# Patient Record
Sex: Male | Born: 1955 | Race: Black or African American | Hispanic: No | Marital: Single | Smoking: Current every day smoker
Health system: Southern US, Community
[De-identification: ages and names within clinical notes are randomized; demographics above are authoritative.]

## PROBLEM LIST (undated history)

## (undated) DIAGNOSIS — F10239 Alcohol dependence with withdrawal, unspecified: Secondary | ICD-10-CM

## (undated) DIAGNOSIS — Z01810 Encounter for preprocedural cardiovascular examination: Secondary | ICD-10-CM

## (undated) DIAGNOSIS — J9600 Acute respiratory failure, unspecified whether with hypoxia or hypercapnia: Secondary | ICD-10-CM

## (undated) DIAGNOSIS — E119 Type 2 diabetes mellitus without complications: Secondary | ICD-10-CM

## (undated) DIAGNOSIS — H5440 Blindness, one eye, unspecified eye: Secondary | ICD-10-CM

## (undated) DIAGNOSIS — H5410 Blindness, one eye, low vision other eye, unspecified eyes: Secondary | ICD-10-CM

## (undated) DIAGNOSIS — I739 Peripheral vascular disease, unspecified: Secondary | ICD-10-CM

## (undated) DIAGNOSIS — K7469 Other cirrhosis of liver: Secondary | ICD-10-CM

## (undated) DIAGNOSIS — D649 Anemia, unspecified: Secondary | ICD-10-CM

## (undated) DIAGNOSIS — H5450 Low vision, one eye, unspecified eye: Secondary | ICD-10-CM

## (undated) HISTORY — PX: KNEE SURGERY: SHX244

## (undated) HISTORY — DX: Acute respiratory failure, unspecified whether with hypoxia or hypercapnia: J96.00

## (undated) HISTORY — DX: Blindness, one eye, low vision other eye, unspecified eyes: H54.10

## (undated) HISTORY — PX: EYE SURGERY: SHX253

## (undated) HISTORY — DX: Type 2 diabetes mellitus without complications: E11.9

## (undated) HISTORY — DX: Peripheral vascular disease, unspecified: I73.9

## (undated) HISTORY — DX: Low vision, one eye, unspecified eye: H54.50

## (undated) HISTORY — DX: Encounter for preprocedural cardiovascular examination: Z01.810

## (undated) HISTORY — DX: Blindness, one eye, unspecified eye: H54.40

## (undated) HISTORY — DX: Other cirrhosis of liver: K74.69

## (undated) HISTORY — DX: Alcohol dependence with withdrawal, unspecified: F10.239

## (undated) HISTORY — DX: Hypomagnesemia: E83.42

## (undated) HISTORY — DX: Anemia, unspecified: D64.9

---

## 2014-09-13 ENCOUNTER — Encounter: Payer: Self-pay | Admitting: Vascular Surgery

## 2014-09-14 ENCOUNTER — Encounter: Payer: Self-pay | Admitting: Vascular Surgery

## 2014-09-14 ENCOUNTER — Other Ambulatory Visit: Payer: Self-pay

## 2014-09-14 ENCOUNTER — Ambulatory Visit (INDEPENDENT_AMBULATORY_CARE_PROVIDER_SITE_OTHER): Payer: Medicaid Other | Admitting: Vascular Surgery

## 2014-09-14 VITALS — BP 130/83 | HR 85 | Resp 18 | Ht 69.5 in | Wt 152.0 lb

## 2014-09-14 DIAGNOSIS — I70269 Atherosclerosis of native arteries of extremities with gangrene, unspecified extremity: Secondary | ICD-10-CM

## 2014-09-14 HISTORY — DX: Atherosclerosis of native arteries of extremities with gangrene, unspecified extremity: I70.269

## 2014-09-14 NOTE — Progress Notes (Signed)
Referred by:  Joaquin Music, NP [address] [place], [place] [address]  Reason for referral: left foot gangrene  History of Present Illness  Greg Davis is a 59 y.o. ([birth year]) male who presents with chief complaint: pain in left foot.  Onset of symptom occurred 3 months ago.  Pain is described as sharp, severity 1-10/10, and associated with movement and rest.  Patient has attempted to treat this pain with pain medications.  The patient has rest pain symptoms also and left ischemic toes.  The patient denies any fever or chills.  He does note some drainage from the left toes.  Atherosclerotic risks include: DM, smoking   Past Medical History  Diagnosis Date  . Diabetes mellitus without complication   . Anemia   . Other cirrhosis of liver   . Acute respiratory failure, unspecified whether with hypoxia or hypercapnia   . Alcohol dependence with withdrawal   . Hypomagnesemia   . 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 Knee surgery    . Eye surgery      History   Social History  . Marital Status: Single    Spouse Name: N/A  . Number of Children: N/A  . Years of Education: N/A   Occupational History  . Not on file.   Social History Main Topics  . Smoking status: Current Every Day Smoker -- 15 years    Types: Cigarettes  . Smokeless tobacco: Never Used  . Alcohol Use: 0.0 oz/week    0 Standard drinks or equivalent per week  . Drug Use: No  . Sexual Activity: Not on file   Other Topics Concern  . Not on file   Social History Narrative  . No narrative on file    Family History: patient is unable to recall his family's medical history  Current Outpatient Prescriptions  Medication Sig Dispense Refill  . ARTIFICIAL TEAR OINTMENT OP Apply to eye 3 (three) times daily.    Marland Kitchen aspirin 81 MG tablet Take 81 mg by mouth daily.    Marland Kitchen diltiazem (TIAZAC) 240 MG 24 hr capsule Take 240 mg by  mouth daily.    . famotidine (PEPCID) 20 MG tablet Take 20 mg by mouth 2 (two) times daily.    . folic acid (FOLVITE) 1 MG tablet Take 1 mg by mouth daily.    . metoprolol succinate (TOPROL-XL) 25 MG 24 hr tablet Take 25 mg by mouth daily. Take 1/2 tablet bid    . Oxycodone HCl 10 MG TABS Take 10 mg by mouth daily. 10 mg daily prior to wound care  Per medication record from nursing home    . polyethylene glycol (MIRALAX / GLYCOLAX) packet Take 17 g by mouth daily.    . pregabalin (LYRICA) 50 MG capsule Take 50 mg by mouth 2 (two) times daily.    Marland Kitchen senna (SENOKOT) 8.6 MG TABS tablet Take 2 tablets by mouth at bedtime.    . thiamine (VITAMIN B-1) 100 MG tablet Take 100 mg by mouth daily.    Marland Kitchen HYDROcodone-acetaminophen (NORCO/VICODIN) 5-325 MG per tablet Take 1 tablet by mouth every 6 (six) hours as needed for moderate pain.    . traMADol (ULTRAM) 50 MG tablet Take 50 mg by mouth every 6 (six) hours as needed.     No current facility-administered medications for this visit.     No Known Allergies   REVIEW  OF SYSTEMS:  (Positives checked otherwise negative)  CARDIOVASCULAR:  []  chest pain, []  chest pressure, []  palpitations, []  shortness of breath when laying flat, []  shortness of breath with exertion,  [x]  pain in feet when walking, [x]  pain in feet when laying flat, []  history of blood clot in veins (DVT), []  history of phlebitis, []  swelling in legs, []  varicose veins  PULMONARY:  []  productive cough, []  asthma, []  wheezing  NEUROLOGIC:  []  weakness in arms or legs, []  numbness in arms or legs, []  difficulty speaking or slurred speech, []  temporary loss of vision in one eye, []  dizziness  HEMATOLOGIC:  []  bleeding problems, []  problems with blood clotting too easily  MUSCULOSKEL:  []  joint pain, []  joint swelling  GASTROINTEST:  []  vomiting blood, []  blood in stool     GENITOURINARY:  []  burning with urination, []  blood in urine  PSYCHIATRIC:  []  history of major  depression  INTEGUMENTARY:  []  rashes, [x]  ulcers  CONSTITUTIONAL:  []  fever, []  chills   For VQI Use Only  PRE-ADM LIVING: Nursing home  AMB STATUS: Wheelchair  CAD Sx: None  PRIOR CHF: None  STRESS TEST: [x]  No, [ ]  Normal, [ ]  + ischemia, [ ]  + MI, [ ]  Both   Physical Examination  Filed Vitals:   09/14/14 1441  BP: 130/83  Pulse: 85  Resp: 18  Height: 5' 9.5" (1.765 m)  Weight: 152 lb (68.947 kg)   Body mass index is 22.13 kg/(m^2).  General: A&O x 3, WD, somewhat cachectic  Head: Becker/AT  Ear/Nose/Throat: Hearing grossly intact, nares w/o erythema or drainage, oropharynx w/o Erythema/Exudate, Mallampati score: 3  Eyes: PERRLA, EOMI  Neck: Supple, no nuchal rigidity, no palpable LAD  Pulmonary: Sym exp, good air movt, CTAB, no rales, rhonchi, & wheezing  Cardiac: RRR, Nl S1, S2, no Murmurs, rubs or gallops  Vascular: Vessel Right Left  Radial Palpable Palpable  Brachial Palpable Palpable  Carotid Palpable, without bruit Palpable, without bruit  Aorta Not palpable N/A  Femoral Faintly Palpable Not Palpable  Popliteal Not palpable Not palpable  PT Not Palpable Not Palpable  DP Not Palpable Not Palpable   Gastrointestinal: soft, NTND, -G/R, - HSM, - masses, - CVAT B  Musculoskeletal: M/S 5/5 in BUE, BLE test limited due to pain in L foot, Extremities without ischemic changes , L 3rd and 4th toe gangrene with some smell without frank purulent drainage, pain in L foot with manipulation  Neurologic: CN 2-12 intact , Pain and light touch intact in extremities except decreased in feet , Motor exam as listed above  Psychiatric: psychomotor delay, judgment appears to be intact, somewhat flat affect  Dermatologic: See M/S exam for extremity exam, no rashes otherwise noted  Lymph : No Cervical, Axillary, or Inguinal lymphadenopathy    Outside Studies/Documentation 1 pages of outside documents were reviewed including: outside arterial study.  Medical  Decision Making  Karma Ganjalbert Jager is a 59 y.o. 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  His co-morbidities, however, may limit his options, especially if his liver's synthetic function is limited.  Will probably need to admit the patient tomorrow to Hospitalist service for medical optimization given known history of cirrhosis and physical exam appearance consistent with protein malnutrition along with diabetes  I discussed with the patient the natural  history of critical limb ischemia: 25% require amputation in one year, 50% are able to maintain their limbs in one year, and 25-30% die in one year due to comorbidities.  Given the limb threatening status of this patient, I recommend an aggressive work up including proceeding with an: Aortogram, Bilateral runoff and ? LLE intervention. I discussed with the patient the nature of angiographic procedures, especially the limited patencies of any endovascular intervention. The patient is aware of that the risks of an angiographic procedure include but are not limited to: bleeding, infection, access site complications, embolization, rupture of treated vessel, dissection, possible need for emergent surgical intervention, and possible need for surgical procedures to treat the patient's pathology. The patient is aware of the risks and agrees to proceed.  The procedure is scheduled for: tomorrow 09/15/14.  I discussed in depth with the patient the nature of atherosclerosis, and emphasized the importance of maximal medical management including strict control of blood pressure, blood glucose, and lipid levels, antiplatelet agents, obtaining regular exercise, and cessation of smoking.  The patient is aware that without maximal medical management the underlying atherosclerotic disease process will  progress, limiting the benefit of any interventions. The patient is currently not on a statin.  Lipid panel along with LFT and nutrition labs will be drawn tomorrow. The patient is currently on an anti-platelet: ASA. Pt will also need baseline BLE ABI at some point.  Thank you for allowing [place] to participate in this patient's care.  Leonides Sake, MD [facility] Office: [phone] Pager: [phone]  09/14/2014, 3:15 PM

## 2014-09-15 ENCOUNTER — Ambulatory Visit (HOSPITAL_COMMUNITY)
Admission: RE | Admit: 2014-09-15 | Discharge: 2014-09-15 | Disposition: A | Discharge status: Still In Hospital | Payer: Medicaid Other | Source: Ambulatory Visit | Attending: Vascular Surgery | Admitting: Vascular Surgery

## 2014-09-15 ENCOUNTER — Encounter (HOSPITAL_COMMUNITY): Payer: Self-pay | Admitting: Radiology

## 2014-09-15 ENCOUNTER — Emergency Department (HOSPITAL_COMMUNITY): Payer: Medicaid Other

## 2014-09-15 ENCOUNTER — Inpatient Hospital Stay (HOSPITAL_COMMUNITY): Payer: Medicaid Other

## 2014-09-15 ENCOUNTER — Inpatient Hospital Stay (HOSPITAL_COMMUNITY)
Admission: EM | Admit: 2014-09-15 | Discharge: 2014-09-21 | DRG: 240 | Disposition: A | Discharge status: Skilled Nursing Facility | Payer: Medicaid Other | Attending: Internal Medicine | Admitting: Internal Medicine

## 2014-09-15 ENCOUNTER — Encounter (HOSPITAL_COMMUNITY)
Admission: RE | Disposition: A | Discharge status: Still In Hospital | Payer: Medicaid Other | Source: Ambulatory Visit | Attending: Vascular Surgery

## 2014-09-15 ENCOUNTER — Encounter: Payer: Self-pay | Admitting: Internal Medicine

## 2014-09-15 DIAGNOSIS — K7030 Alcoholic cirrhosis of liver without ascites: Secondary | ICD-10-CM | POA: Diagnosis present

## 2014-09-15 DIAGNOSIS — Z79891 Long term (current) use of opiate analgesic: Secondary | ICD-10-CM | POA: Diagnosis not present

## 2014-09-15 DIAGNOSIS — E1152 Type 2 diabetes mellitus with diabetic peripheral angiopathy with gangrene: Secondary | ICD-10-CM | POA: Diagnosis present

## 2014-09-15 DIAGNOSIS — I959 Hypotension, unspecified: Secondary | ICD-10-CM | POA: Diagnosis present

## 2014-09-15 DIAGNOSIS — R7303 Prediabetes: Secondary | ICD-10-CM | POA: Diagnosis present

## 2014-09-15 DIAGNOSIS — I739 Peripheral vascular disease, unspecified: Secondary | ICD-10-CM | POA: Diagnosis present

## 2014-09-15 DIAGNOSIS — R0682 Tachypnea, not elsewhere classified: Secondary | ICD-10-CM | POA: Diagnosis present

## 2014-09-15 DIAGNOSIS — R109 Unspecified abdominal pain: Secondary | ICD-10-CM

## 2014-09-15 DIAGNOSIS — F17210 Nicotine dependence, cigarettes, uncomplicated: Secondary | ICD-10-CM | POA: Diagnosis present

## 2014-09-15 DIAGNOSIS — I70269 Atherosclerosis of native arteries of extremities with gangrene, unspecified extremity: Secondary | ICD-10-CM | POA: Diagnosis present

## 2014-09-15 DIAGNOSIS — L089 Local infection of the skin and subcutaneous tissue, unspecified: Secondary | ICD-10-CM

## 2014-09-15 DIAGNOSIS — Z89612 Acquired absence of left leg above knee: Secondary | ICD-10-CM | POA: Diagnosis not present

## 2014-09-15 DIAGNOSIS — Z79899 Other long term (current) drug therapy: Secondary | ICD-10-CM | POA: Diagnosis not present

## 2014-09-15 DIAGNOSIS — S78112A Complete traumatic amputation at level between left hip and knee, initial encounter: Secondary | ICD-10-CM

## 2014-09-15 DIAGNOSIS — T424X5A Adverse effect of benzodiazepines, initial encounter: Secondary | ICD-10-CM | POA: Diagnosis not present

## 2014-09-15 DIAGNOSIS — I4891 Unspecified atrial fibrillation: Secondary | ICD-10-CM | POA: Diagnosis not present

## 2014-09-15 DIAGNOSIS — I10 Essential (primary) hypertension: Secondary | ICD-10-CM | POA: Diagnosis present

## 2014-09-15 DIAGNOSIS — I70262 Atherosclerosis of native arteries of extremities with gangrene, left leg: Secondary | ICD-10-CM | POA: Diagnosis not present

## 2014-09-15 DIAGNOSIS — E876 Hypokalemia: Secondary | ICD-10-CM | POA: Diagnosis present

## 2014-09-15 DIAGNOSIS — K8020 Calculus of gallbladder without cholecystitis without obstruction: Secondary | ICD-10-CM | POA: Diagnosis present

## 2014-09-15 DIAGNOSIS — E46 Unspecified protein-calorie malnutrition: Secondary | ICD-10-CM | POA: Diagnosis present

## 2014-09-15 DIAGNOSIS — D638 Anemia in other chronic diseases classified elsewhere: Secondary | ICD-10-CM | POA: Diagnosis present

## 2014-09-15 DIAGNOSIS — F1020 Alcohol dependence, uncomplicated: Secondary | ICD-10-CM

## 2014-09-15 DIAGNOSIS — D649 Anemia, unspecified: Secondary | ICD-10-CM | POA: Diagnosis present

## 2014-09-15 DIAGNOSIS — H5412 Blindness, left eye, low vision right eye: Secondary | ICD-10-CM | POA: Diagnosis present

## 2014-09-15 DIAGNOSIS — I480 Paroxysmal atrial fibrillation: Secondary | ICD-10-CM | POA: Diagnosis present

## 2014-09-15 DIAGNOSIS — G9340 Encephalopathy, unspecified: Secondary | ICD-10-CM

## 2014-09-15 DIAGNOSIS — R1011 Right upper quadrant pain: Secondary | ICD-10-CM

## 2014-09-15 DIAGNOSIS — K7460 Unspecified cirrhosis of liver: Secondary | ICD-10-CM

## 2014-09-15 DIAGNOSIS — Z7982 Long term (current) use of aspirin: Secondary | ICD-10-CM | POA: Diagnosis not present

## 2014-09-15 HISTORY — DX: Unspecified atrial fibrillation: I48.91

## 2014-09-15 LAB — BASIC METABOLIC PANEL
ANION GAP: 9 (ref 5–15)
BUN: 10 mg/dL (ref 6–20)
CO2: 25 mmol/L (ref 22–32)
Calcium: 8.8 mg/dL — ABNORMAL LOW (ref 8.9–10.3)
Chloride: 103 mmol/L (ref 101–111)
Creatinine, Ser: 0.75 mg/dL (ref 0.61–1.24)
GFR calc Af Amer: >60 mL/min (ref >60)
GFR calc non Af Amer: >60 mL/min (ref >60)
Glucose, Bld: 107 mg/dL — ABNORMAL HIGH (ref 65–99)
Potassium: 3.6 mmol/L (ref 3.5–5.1)
SODIUM: 137 mmol/L (ref 135–145)

## 2014-09-15 LAB — GLUCOSE, CAPILLARY
GLUCOSE-CAPILLARY: 84 mg/dL (ref 65–99)
Glucose-Capillary: 86 mg/dL (ref 65–99)

## 2014-09-15 LAB — PROTIME-INR
INR: 1.25 (ref 0.00–1.49)
Prothrombin Time: 15.8 seconds — ABNORMAL HIGH (ref 11.6–15.2)

## 2014-09-15 LAB — POCT I-STAT, CHEM 8
BUN: 12 mg/dL (ref 6–20)
CHLORIDE: 101 mmol/L (ref 101–111)
Calcium, Ion: 1.2 mmol/L (ref 1.12–1.23)
Creatinine, Ser: 0.7 mg/dL (ref 0.61–1.24)
GLUCOSE: 100 mg/dL — AB (ref 65–99)
HEMATOCRIT: 39 % (ref 39.0–52.0)
HEMOGLOBIN: 13.3 g/dL (ref 13.0–17.0)
POTASSIUM: 3.8 mmol/L (ref 3.5–5.1)
Sodium: 138 mmol/L (ref 135–145)
TCO2: 22 mmol/L (ref 0–100)

## 2014-09-15 LAB — CBC WITH DIFFERENTIAL/PLATELET
BASOS ABS: 0 10*3/uL (ref 0.0–0.1)
Basophils Relative: 0 % (ref 0–1)
Eosinophils Absolute: 0.1 10*3/uL (ref 0.0–0.7)
Eosinophils Relative: 1 % (ref 0–5)
HEMATOCRIT: 33 % — AB (ref 39.0–52.0)
HEMOGLOBIN: 11.1 g/dL — AB (ref 13.0–17.0)
LYMPHS ABS: 1.5 10*3/uL (ref 0.7–4.0)
Lymphocytes Relative: 12 % (ref 12–46)
MCH: 30.3 pg (ref 26.0–34.0)
MCHC: 33.6 g/dL (ref 30.0–36.0)
MCV: 90.2 fL (ref 78.0–100.0)
MONO ABS: 1 10*3/uL (ref 0.1–1.0)
Monocytes Relative: 9 % (ref 3–12)
Neutro Abs: 9.5 10*3/uL — ABNORMAL HIGH (ref 1.7–7.7)
Neutrophils Relative %: 78 % — ABNORMAL HIGH (ref 43–77)
PLATELETS: 304 10*3/uL (ref 150–400)
RBC: 3.66 MIL/uL — ABNORMAL LOW (ref 4.22–5.81)
RDW: 12.6 % (ref 11.5–15.5)
WBC: 12.1 10*3/uL — ABNORMAL HIGH (ref 4.0–10.5)

## 2014-09-15 LAB — LIPID PANEL
CHOL/HDL RATIO: 4.3 ratio
Cholesterol: 120 mg/dL (ref 0–200)
HDL: 28 mg/dL — ABNORMAL LOW (ref >40)
LDL Cholesterol: 77 mg/dL (ref 0–99)
TRIGLYCERIDES: 75 mg/dL (ref <150)
VLDL: 15 mg/dL (ref 0–40)

## 2014-09-15 LAB — HEPATIC FUNCTION PANEL
ALT: 16 U/L — AB (ref 17–63)
AST: 22 U/L (ref 15–41)
Albumin: 3.2 g/dL — ABNORMAL LOW (ref 3.5–5.0)
Alkaline Phosphatase: 84 U/L (ref 38–126)
BILIRUBIN DIRECT: 0.2 mg/dL (ref 0.1–0.5)
BILIRUBIN INDIRECT: 0.7 mg/dL (ref 0.3–0.9)
Total Bilirubin: 0.9 mg/dL (ref 0.3–1.2)
Total Protein: 7.9 g/dL (ref 6.5–8.1)

## 2014-09-15 LAB — MAGNESIUM: Magnesium: 1.4 mg/dL — ABNORMAL LOW (ref 1.7–2.4)

## 2014-09-15 LAB — HEPARIN LEVEL (UNFRACTIONATED)

## 2014-09-15 LAB — TROPONIN I: TROPONIN I: 0.04 ng/mL — AB (ref <0.031)

## 2014-09-15 LAB — PREALBUMIN: Prealbumin: 8.2 mg/dL — ABNORMAL LOW (ref 18–38)

## 2014-09-15 LAB — APTT: aPTT: 45 seconds — ABNORMAL HIGH (ref 24–37)

## 2014-09-15 LAB — C-REACTIVE PROTEIN: CRP: 19.2 mg/dL — AB (ref <1.0)

## 2014-09-15 SURGERY — ABDOMINAL AORTOGRAM W/LOWER EXTREMITY
Anesthesia: LOCAL

## 2014-09-15 MED ORDER — DILTIAZEM HCL 25 MG/5ML IV SOLN
15.0000 mg | Freq: Once | INTRAVENOUS | Status: AC
  Administered 2014-09-15: 15 mg via INTRAVENOUS

## 2014-09-15 MED ORDER — HEPARIN BOLUS VIA INFUSION
2000.0000 [IU] | Freq: Once | INTRAVENOUS | Status: AC
  Administered 2014-09-15: 2000 [IU] via INTRAVENOUS
  Filled 2014-09-15: qty 2000

## 2014-09-15 MED ORDER — SODIUM CHLORIDE 0.9 % IJ SOLN
3.0000 mL | Freq: Two times a day (BID) | INTRAMUSCULAR | Status: DC
  Administered 2014-09-15 – 2014-09-21 (×8): 3 mL via INTRAVENOUS

## 2014-09-15 MED ORDER — SODIUM CHLORIDE 0.9 % IV SOLN
INTRAVENOUS | Status: DC
  Administered 2014-09-15: 18:00:00 via INTRAVENOUS

## 2014-09-15 MED ORDER — DILTIAZEM LOAD VIA INFUSION
20.0000 mg | Freq: Once | INTRAVENOUS | Status: AC
  Administered 2014-09-15: 20 mg via INTRAVENOUS

## 2014-09-15 MED ORDER — MORPHINE SULFATE 10 MG/ML IJ SOLN: 2.0000 mg | Freq: Once | INTRAMUSCULAR | Status: DC

## 2014-09-15 MED ORDER — AMIODARONE HCL IN DEXTROSE 360-4.14 MG/200ML-% IV SOLN
60.0000 mg/h | INTRAVENOUS | Status: AC
  Administered 2014-09-15: 60 mg/h via INTRAVENOUS
  Filled 2014-09-15: qty 200

## 2014-09-15 MED ORDER — LORAZEPAM 2 MG/ML IJ SOLN
1.0000 mg | Freq: Once | INTRAMUSCULAR | Status: AC
  Administered 2014-09-15: 1 mg via INTRAVENOUS
  Filled 2014-09-15: qty 1

## 2014-09-15 MED ORDER — INSULIN ASPART 100 UNIT/ML ~~LOC~~ SOLN
0.0000 [IU] | SUBCUTANEOUS | Status: DC
  Administered 2014-09-20: 1 [IU] via SUBCUTANEOUS

## 2014-09-15 MED ORDER — ASPIRIN 81 MG PO TABS: 81.0000 mg | ORAL_TABLET | Freq: Every day | ORAL | Status: DC

## 2014-09-15 MED ORDER — DEXTROSE 5 % IV SOLN
5.0000 mg/h | INTRAVENOUS | Status: DC
  Administered 2014-09-15: 5 mg/h via INTRAVENOUS

## 2014-09-15 MED ORDER — AMIODARONE HCL IN DEXTROSE 360-4.14 MG/200ML-% IV SOLN
30.0000 mg/h | INTRAVENOUS | Status: DC
  Administered 2014-09-16 – 2014-09-17 (×2): 30 mg/h via INTRAVENOUS
  Filled 2014-09-15 (×4): qty 200

## 2014-09-15 MED ORDER — DILTIAZEM HCL ER COATED BEADS 240 MG PO CP24
240.0000 mg | ORAL_CAPSULE | Freq: Every day | ORAL | Status: DC
  Administered 2014-09-15: 240 mg via ORAL
  Filled 2014-09-15 (×2): qty 1

## 2014-09-15 MED ORDER — METOPROLOL SUCCINATE ER 25 MG PO TB24
25.0000 mg | ORAL_TABLET | Freq: Every day | ORAL | Status: DC
  Administered 2014-09-15 – 2014-09-19 (×5): 25 mg via ORAL
  Filled 2014-09-15 (×7): qty 1

## 2014-09-15 MED ORDER — HEPARIN BOLUS VIA INFUSION
4000.0000 [IU] | Freq: Once | INTRAVENOUS | Status: AC
  Administered 2014-09-15: 4000 [IU] via INTRAVENOUS
  Filled 2014-09-15: qty 4000

## 2014-09-15 MED ORDER — ASPIRIN 81 MG PO CHEW
81.0000 mg | CHEWABLE_TABLET | Freq: Every day | ORAL | Status: DC
  Administered 2014-09-16 – 2014-09-21 (×6): 81 mg via ORAL
  Filled 2014-09-15 (×6): qty 1

## 2014-09-15 MED ORDER — SODIUM CHLORIDE 0.9 % IV BOLUS (SEPSIS)
1000.0000 mL | Freq: Once | INTRAVENOUS | Status: AC
  Administered 2014-09-15: 1000 mL via INTRAVENOUS

## 2014-09-15 MED ORDER — LIDOCAINE HCL (PF) 1 % IJ SOLN
INTRAMUSCULAR | Status: AC
  Filled 2014-09-15: qty 30

## 2014-09-15 MED ORDER — HEPARIN (PORCINE) IN NACL 2-0.9 UNIT/ML-% IJ SOLN
INTRAMUSCULAR | Status: AC
  Filled 2014-09-15: qty 1000

## 2014-09-15 MED ORDER — SODIUM CHLORIDE 0.9 % IV BOLUS (SEPSIS)
500.0000 mL | Freq: Once | INTRAVENOUS | Status: AC
  Administered 2014-09-15: 500 mL via INTRAVENOUS

## 2014-09-15 MED ORDER — MORPHINE SULFATE 2 MG/ML IJ SOLN
INTRAMUSCULAR | Status: AC
  Administered 2014-09-15: 2 mg via INTRAVENOUS
  Filled 2014-09-15: qty 1

## 2014-09-15 MED ORDER — MAGNESIUM SULFATE 2 GM/50ML IV SOLN
2.0000 g | Freq: Once | INTRAVENOUS | Status: AC
  Administered 2014-09-15: 2 g via INTRAVENOUS
  Filled 2014-09-15: qty 50

## 2014-09-15 MED ORDER — SODIUM CHLORIDE 0.9 % IV BOLUS (SEPSIS)
250.0000 mL | Freq: Once | INTRAVENOUS | Status: AC
  Administered 2014-09-15: 250 mL via INTRAVENOUS

## 2014-09-15 MED ORDER — AMIODARONE LOAD VIA INFUSION
150.0000 mg | Freq: Once | INTRAVENOUS | Status: AC
  Administered 2014-09-15: 150 mg via INTRAVENOUS
  Filled 2014-09-15: qty 83.34

## 2014-09-15 MED ORDER — HEPARIN (PORCINE) IN NACL 100-0.45 UNIT/ML-% IJ SOLN
1500.0000 [IU]/h | INTRAMUSCULAR | Status: DC
  Administered 2014-09-15: 1100 [IU]/h via INTRAVENOUS
  Administered 2014-09-16: 1300 [IU]/h via INTRAVENOUS
  Filled 2014-09-15 (×2): qty 250

## 2014-09-15 MED ORDER — SODIUM CHLORIDE 0.9 % IV SOLN
INTRAVENOUS | Status: DC
  Administered 2014-09-15: 09:00:00 via INTRAVENOUS

## 2014-09-15 NOTE — ED Notes (Signed)
RE PAGED CARDS PER [facility]

## 2014-09-15 NOTE — ED Provider Notes (Signed)
CSN: [id number]     Arrival date & time 09/15/14  0919 History   First MD Initiated Contact with Patient 09/15/14 [phone]     Chief Complaint  Patient presents with  . Tachycardia     (Consider location/radiation/quality/duration/timing/severity/associated sxs/prior Treatment) HPI  59 year old male presents from the catheterization lab after was noted that he was having significant tachycardia. He was scheduled for an angiogram to evaluate why he is having gangrene in his left foot. When he was on the table they noticed that his heart rate was between 150 and 180. The patient denies any current symptoms, including chest pain, shortness of breath, or dizziness. He states he did not sleep well last night because he was thinking about his foot and is concerned that he'll lose his foot. The patient denies any prior heart rate problems or history of arrhythmias. Patient has a past history of alcohol abuse leading to cirrhosis but denies any current alcohol use or recent alcohol use. Due to having a procedure this morning he did not take any of his medicines this morning including his Toprol or diltiazem.  Past Medical History  Diagnosis Date  . Diabetes mellitus without complication   . Anemia   . Other cirrhosis of liver   . Acute respiratory failure, unspecified whether with hypoxia or hypercapnia   . Alcohol dependence with withdrawal   . Hypomagnesemia   . 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 Knee surgery    . Eye surgery     No family history on file. History  Substance Use Topics  . Smoking status: Current Every Day Smoker -- 15 years    Types: Cigarettes  . Smokeless tobacco: Never Used  . Alcohol Use: 0.0 oz/week    0 Standard drinks or equivalent per week    Review of Systems  Constitutional: Negative for fever.  Respiratory: Negative for shortness of breath.   Cardiovascular: Negative for chest  pain, palpitations and leg swelling.  Gastrointestinal: Negative for abdominal pain.  Musculoskeletal: Positive for arthralgias.  Neurological: Negative for dizziness and light-headedness.  All other systems reviewed and are negative.     Allergies  Review of patient's allergies indicates no known allergies.  Home Medications   Prior to Admission medications   Medication Sig Start Date End Date Taking? Authorizing Provider  ARTIFICIAL TEAR OINTMENT OP Apply to eye 3 (three) times daily.    Historical Provider, MD  aspirin 81 MG tablet Take 81 mg by mouth daily.    Historical Provider, MD  diltiazem (TIAZAC) 240 MG 24 hr capsule Take 240 mg by mouth daily.    Historical Provider, MD  famotidine (PEPCID) 20 MG tablet Take 20 mg by mouth 2 (two) times daily.    Historical Provider, MD  folic acid (FOLVITE) 1 MG tablet Take 1 mg by mouth daily.    Historical Provider, MD  HYDROcodone-acetaminophen (NORCO/VICODIN) 5-325 MG per tablet Take 1 tablet by mouth every 6 (six) hours as needed for moderate pain.    Historical Provider, MD  metoprolol succinate (TOPROL-XL) 25 MG 24 hr tablet Take 25 mg by mouth daily. Take 1/2 tablet bid    Historical Provider, MD  Oxycodone HCl 10 MG TABS Take 10 mg by mouth daily. 10 mg daily prior to wound care  Per medication record from nursing home    Historical Provider, MD  polyethylene glycol (MIRALAX / GLYCOLAX) packet Take 17  g by mouth daily.    Historical Provider, MD  pregabalin (LYRICA) 50 MG capsule Take 50 mg by mouth 2 (two) times daily.    Historical Provider, MD  senna (SENOKOT) 8.6 MG TABS tablet Take 2 tablets by mouth at bedtime.    Historical Provider, MD  thiamine (VITAMIN B-1) 100 MG tablet Take 100 mg by mouth daily.    Historical Provider, MD  traMADol (ULTRAM) 50 MG tablet Take 50 mg by mouth every 6 (six) hours as needed.    Historical Provider, MD   There were no vitals taken for this visit. Physical Exam  Constitutional: He is  oriented to person, place, and time. He appears well-developed and well-nourished.  HENT:  Head: Normocephalic and atraumatic.  Right Ear: External ear normal.  Left Ear: External ear normal.  Nose: Nose normal.  Eyes: Right eye exhibits no discharge. Left eye exhibits no discharge.  Neck: Neck supple.  Cardiovascular: Normal heart sounds and intact distal pulses.  An irregular rhythm present. Tachycardia present.   Pulmonary/Chest: Effort normal and breath sounds normal. He has no rales.  Abdominal: Soft. He exhibits no distension. There is no tenderness.  Musculoskeletal: He exhibits no edema.  Neurological: He is alert and oriented to person, place, and time.  Skin: Skin is warm and dry.  Nursing note and vitals reviewed.   ED Course  Procedures (including critical care time) Labs Review Labs Reviewed  BASIC METABOLIC PANEL - Abnormal; Notable for the following:    Glucose, Bld 107 (*)    Calcium 8.8 (*)    All other components within normal limits  CBC WITH DIFFERENTIAL/PLATELET - Abnormal; Notable for the following:    WBC 12.1 (*)    RBC 3.66 (*)    Hemoglobin 11.1 (*)    HCT 33.0 (*)    Neutrophils Relative % 78 (*)    Neutro Abs 9.5 (*)    All other components within normal limits  MAGNESIUM - Abnormal; Notable for the following:    Magnesium 1.4 (*)    All other components within normal limits  PROTIME-INR  APTT  HEPARIN LEVEL (UNFRACTIONATED)    Imaging Review Dg Chest Port 1 View  09/15/2014   CLINICAL DATA:  Tachycardia.  EXAM: PORTABLE CHEST - 1 VIEW  COMPARISON:  November 19, 2013.  FINDINGS: The heart size and mediastinal contours are within normal limits. Both lungs are clear. No pneumothorax or pleural effusion is noted. Old left-sided rib fractures are noted. Degenerative change of left glenohumeral joint is noted.  IMPRESSION: No acute cardiopulmonary abnormality seen.   Electronically Signed   By: Lupita Raider, M.D.   On: 09/15/2014 11:25     EKG  Interpretation   Date/Time:  Thursday Sep 15 2014 09:39:28 EDT Ventricular Rate:  171 PR Interval:    QRS Duration: 76 QT Interval:  280 QTC Calculation: 472 R Axis:   53 Text Interpretation:  Atrial fibrillation with rapid V-rate Ventricular  premature complex RSR' in V1 or V2, probably normal variant ST depression,  probably rate related No old tracing to compare Confirmed by Jaimi Belle  MD,  Trindon Dorton ([id number]) on 09/15/2014 9:44:18 AM       EKG Interpretation  Date/Time:  Thursday Sep 15 2014 12:35:39 EDT Ventricular Rate:  91 PR Interval:  129 QRS Duration: 86 QT Interval:  377 QTC Calculation: 464 R Axis:   60 Text Interpretation:  Sinus rhythm Afib has turned to sinus compared to earlier in the day Confirmed by  Akita Maxim  MD, Khyri Hinzman [phone]) on 09/15/2014 12:58:52 PM       CRITICAL CARE Performed by: Pricilla Loveless T   Total critical care time: 40 minutes  Critical care time was exclusive of separately billable procedures and treating other patients.  Critical care was necessary to treat or prevent imminent or life-threatening deterioration.  Critical care was time spent personally by me on the following activities: development of treatment plan with patient and/or surrogate as well as nursing, discussions with consultants, evaluation of patient's response to treatment, examination of patient, obtaining history from patient or surrogate, ordering and performing treatments and interventions, ordering and review of laboratory studies, ordering and review of radiographic studies, pulse oximetry and re-evaluation of patient's condition.  MDM   Final diagnoses:  Atrial fibrillation with RVR    Patient with asymptomatic atrial fibrillation with RVR. Patient is a very poor historian and denies any prior arrhythmias but he is noted to be on Cardizem and metoprolol. The patient initially was difficult to control with IV diltiazem boluses and drip. Cardiology consult, Dr. Patty Sermons has  made recommendations. After continued diltiazem therapy and fluids his heart rate did convert to sinus his heart rate was in the 90s and 100s when he was admitted to the internal medicine teaching service. Given borderline low blood pressures he will be admitted to the stepdown unit. Patient was given a dose of IV Ativan given he appears anxious about his foot and the possibility of losing it.    Pricilla Loveless, MD 09/15/14 [phone]

## 2014-09-15 NOTE — Progress Notes (Signed)
[facility] who states patient is normally very alert and oriented to person place and time, very soft spoken.  She also states he is independent ambulating and not fidgety.  Records requested to be faxed over.  Dr. Yetta BarreJones updated.  Colman Caterarpley, Tanashia Ciesla Danielle

## 2014-09-15 NOTE — Progress Notes (Signed)
Unable to complete H&P, pt arousable but lethargic, oriented to self only, will continue to monitor, medical records requested from [facility].

## 2014-09-15 NOTE — Interval H&P Note (Signed)
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 After consideration of risks, benefits and other options for treatment, the patient has consented to  Procedure(s): Abdominal Aortogram w/Lower Extremity (N/A) as a surgical intervention .  The patient's history has been reviewed, patient examined, no change in status, stable for surgery.  I have reviewed the patient's chart and labs.  Questions were answered to the patient's satisfaction.     Leonides Sakehen, Afnan Emberton

## 2014-09-15 NOTE — Consult Note (Signed)
CARDIOLOGY CONSULT NOTE   Patient ID: Greg Davis MRN: [id number], DOB/AGE: November 23, 1955   Admit date: 09/15/2014 Date of Consult: 09/15/2014   Primary Physician: No primary care provider on file. Primary Cardiologist: None.  Patient lives in [place] area  Pt. Profile  59 year old gentleman sent to the emergency room from short stay with atrial fibrillation and rapid ventricular response.  Problem List  Past Medical History  Diagnosis Date  . Diabetes mellitus without complication   . Anemia   . Other cirrhosis of liver   . Acute respiratory failure, unspecified whether with hypoxia or hypercapnia   . Alcohol dependence with withdrawal   . Hypomagnesemia   . 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 Knee surgery    . Eye surgery       Allergies  No Known Allergies  HPI   This 59 year old African-American gentleman is seen because of atrial fibrillation with rapid ventricular response.  The patient is a poor historian.  He cannot tell me at the present time whether he is taking any heart medicines are not at home.  He was sent to [facility] yesterday and saw Dr. Imogene Burn who noted that the patient had gangrene of his left foot and arranged for an arteriogram today.  Prior to the arteriogram being taken, the patient was noted to be in atrial fibrillation with rapid ventricular response.  At the current time he denies any knowledge of prior problems with atrial fibrillation.  However his list of home medications shows that he is supposed to be taking diltiazem 240 mg daily and Toprol-XL 25 mg daily.  He is also supposed to be taking a daily baby aspirin.  The patient has a history of diabetes and a past history of alcohol excess.  He denies any past history of myocardial infarction or chest pains.  However his history is unreliable.  He has received some Ativan earlier today for agitation and at the present time his  speech is somewhat mumbling and tangential and he is unable to answer questions about his health.  Inpatient Medications  . diltiazem  240 mg Oral Daily  . metoprolol succinate  25 mg Oral Daily    Family History History reviewed. No pertinent family history.   Social History History   Social History  . Marital Status: Single    Spouse Name: N/A  . Number of Children: N/A  . Years of Education: N/A   Occupational History  . Not on file.   Social History Main Topics  . Smoking status: Current Every Day Smoker -- 15 years    Types: Cigarettes  . Smokeless tobacco: Never Used  . Alcohol Use: 0.0 oz/week    0 Standard drinks or equivalent per week  . Drug Use: No  . Sexual Activity: Not on file   Other Topics Concern  . Not on file   Social History Narrative     Review of Systems  General:  No chills, fever, night sweats or weight changes.  Cardiovascular:  No chest pain, dyspnea on exertion, edema, orthopnea, palpitations, paroxysmal nocturnal dyspnea. Dermatological: No rash, lesions/masses Respiratory: No cough, dyspnea Urologic: No hematuria, dysuria Abdominal:   No nausea, vomiting, diarrhea, bright red blood per rectum, melena, or hematemesis Neurologic:  No visual changes, wkns, changes in mental status. All other systems reviewed and are otherwise negative except as noted above.  Review of systems is not obtainable  at this time because of his mental status.  Physical Exam  Blood pressure 92/57, pulse 93, temperature 98.1 F (36.7 C), temperature source Oral, resp. rate 20, height 5\' 9"  (1.753 m), weight 152 lb (68.947 kg), SpO2 90 %.  General: Mildly agitated.  Appears to be somewhat disoriented. HEENT: Blind in left eye.  Neck: Supple without bruits or JVD. Lungs:  Resp regular and unlabored, CTA. Heart: Rapid irregular rate.  No murmur gallop or rub. Abdomen: Soft, non-tender, non-distended, BS + x 4.  Extremities: Left foot is wrapped.  Patient has  gangrene of toes of left foot by history.  Labs  No results for input(s): CKTOTAL, CKMB, TROPONINI in the last 72 hours. Lab Results  Component Value Date   WBC 12.1* 09/15/2014   HGB 11.1* 09/15/2014   HCT 33.0* 09/15/2014   MCV 90.2 09/15/2014   PLT 304 09/15/2014    Recent Labs Lab 09/15/14 0804  09/15/14 1000  NA  --   < > 137  K  --   < > 3.6  CL  --   < > 103  CO2  --   --  25  BUN  --   < > 10  CREATININE  --   < > 0.75  CALCIUM  --   --  8.8*  PROT 7.9  --   --   BILITOT 0.9  --   --   ALKPHOS 84  --   --   ALT 16*  --   --   AST 22  --   --   GLUCOSE  --   < > 107*  < > = values in this interval not displayed. Lab Results  Component Value Date   CHOL 120 09/15/2014   HDL 28* 09/15/2014   LDLCALC 77 09/15/2014   TRIG 75 09/15/2014   No results found for: DDIMER  Radiology/Studies  Dg Chest Port 1 View  09/15/2014   CLINICAL DATA:  Tachycardia.  EXAM: PORTABLE CHEST - 1 VIEW  COMPARISON:  November 19, 2013.  FINDINGS: The heart size and mediastinal contours are within normal limits. Both lungs are clear. No pneumothorax or pleural effusion is noted. Old left-sided rib fractures are noted. Degenerative change of left glenohumeral joint is noted.  IMPRESSION: No acute cardiopulmonary abnormality seen.   Electronically Signed   By: Lupita RaiderJames  Green Jr, M.D.   On: 09/15/2014 11:25    ECG  15-Sep-2014 09:39:28 [facility]/ED ROUTINE RECORD Atrial fibrillation with rapid V-rate Ventricular premature complex RSR' in V1 or V2, probably normal variant ST depression, probably rate related No old tracing to compare Confirmed by GOLDSTON MD, SCOTT ([id number]) on 09/15/2014 9:44:18 AM Personally reviewed. ASSESSMENT AND PLAN  1.  Atrial fibrillation with rapid ventricular response, apparently of recent onset since his heart rate yesterday at Dr. Nicky Pughhen's was recorded at 85.  His outpatient medication list includes diltiazem CD and metoprolol suggesting that he has  probably had atrial fibrillation in the past as well.  He is not currently able to give a coherent history. 2.  Gangrene of toes of left foot 3.  Malnutrition 4.  Diabetes mellitus 5.  Past history of alcohol excess rule out withdrawal 6.  Hypotension 7.  Anemia,  Recommendation: We will add IV normal saline to support blood pressure to allow us to subsequently add IV diltiazem for rate control.  IV heparin for atrial fibrillation.  Chads vasc*score would be at least 2 for diabetes and vascular disease.  At some point we will want an echocardiogram although it would be preferable if we can get his heart rate slowed down somewhat first. Once he is stabilized he will need to proceed with arteriogram as per Dr. Imogene Burn. Will follow with you.   Karie Schwalbe M.D. 09/15/2014, 11:31 AM

## 2014-09-15 NOTE — ED Notes (Signed)
Pt presents from short stay where he was to have an arteriogram on his left foot with tachycardia. Pt denies any CP or symptoms associated with tachycardia. Per vascular surhgery pt was 150

## 2014-09-15 NOTE — ED Notes (Signed)
Per pharmacy heparin dosage can be administered as written with resulting PT/INR

## 2014-09-15 NOTE — Progress Notes (Signed)
ANTICOAGULATION CONSULT NOTE - Initial Consult  Pharmacy Consult for Heparin  Indication: atrial fibrillation  No Known Allergies  Patient Measurements: Height: 5\' 9"  (175.3 cm) Weight: 152 lb (68.947 kg) IBW/kg (Calculated) : 70.7  Vital Signs: Temp: 98.1 F (36.7 C) (05/26 0953) Temp Source: Oral (05/26 0953) BP: 101/61 mmHg (05/26 1151) Pulse Rate: 150 (05/26 1151)  Labs:  Recent Labs  09/15/14 0812 09/15/14 1000  HGB 13.3 11.1*  HCT 39.0 33.0*  PLT  --  304  CREATININE 0.70 0.75    Estimated Creatinine Clearance: 98.1 mL/min (by C-G formula based on Cr of 0.75).   Medical History: Past Medical History  Diagnosis Date  . Diabetes mellitus without complication   . Anemia   . Other cirrhosis of liver   . Acute respiratory failure, unspecified whether with hypoxia or hypercapnia   . Alcohol dependence with withdrawal   . Hypomagnesemia   . 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 AFib on metoprolol and diltiazem PTA, so possible history Heme: H&H 11.1/33, no bleeding noted  Plan: -Initiate heparin with a bolus of 4000 units with an infusion of 1100 units/hr -HL in 6 hrs -Daily CBC, HL -Montior for s/sx of bleeding  Goal of Therapy:  Heparin level 0.3-0.7 units/ml Monitor platelets by anticoagulation protocol: Yes   Plan:  -Initiate heparin with a bolus of 4000 units with an infusion of 1100 units/hr -HL in 6 hrs -Daily CBC, HL -Montior for s/sx of bleeding  Isaac BlissMichael Bruna Dills, PharmD, BCPS Clinical Pharmacist Pager [phone] 09/15/2014 11:59 AM

## 2014-09-15 NOTE — H&P (View-Only) (Signed)
  Referred by:  Emily Harless, NP [address] [place], [place] [address]  Reason for referral: left foot gangrene  History of Present Illness  Greg Davis is a 59 y.o. (05/30/1955) male who presents with chief complaint: pain in left foot.  Onset of symptom occurred 3 months ago.  Pain is described as sharp, severity 1-10/10, and associated with movement and rest.  Patient has attempted to treat this pain with pain medications.  The patient has rest pain symptoms also and left ischemic toes.  The patient denies any fever or chills.  He does note some drainage from the left toes.  Atherosclerotic risks include: DM, smoking   Past Medical History  Diagnosis Date  . Diabetes mellitus without complication   . Anemia   . Other cirrhosis of liver   . Acute respiratory failure, unspecified whether with hypoxia or hypercapnia   . Alcohol dependence with withdrawal   . Hypomagnesemia   . 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 Knee surgery    . Eye surgery      History   Social History  . Marital Status: Single    Spouse Name: N/A  . Number of Children: N/A  . Years of Education: N/A   Occupational History  . Not on file.   Social History Main Topics  . Smoking status: Current Every Day Smoker -- 15 years    Types: Cigarettes  . Smokeless tobacco: Never Used  . Alcohol Use: 0.0 oz/week    0 Standard drinks or equivalent per week  . Drug Use: No  . Sexual Activity: Not on file   Other Topics Concern  . Not on file   Social History Narrative  . No narrative on file    Family History: patient is unable to recall his family's medical history  Current Outpatient Prescriptions  Medication Sig Dispense Refill  . ARTIFICIAL TEAR OINTMENT OP Apply to eye 3 (three) times daily.    . aspirin 81 MG tablet Take 81 mg by mouth daily.    . diltiazem (TIAZAC) 240 MG 24 hr capsule Take 240 mg by  mouth daily.    . famotidine (PEPCID) 20 MG tablet Take 20 mg by mouth 2 (two) times daily.    . folic acid (FOLVITE) 1 MG tablet Take 1 mg by mouth daily.    . metoprolol succinate (TOPROL-XL) 25 MG 24 hr tablet Take 25 mg by mouth daily. Take 1/2 tablet bid    . Oxycodone HCl 10 MG TABS Take 10 mg by mouth daily. 10 mg daily prior to wound care  Per medication record from nursing home    . polyethylene glycol (MIRALAX / GLYCOLAX) packet Take 17 g by mouth daily.    . pregabalin (LYRICA) 50 MG capsule Take 50 mg by mouth 2 (two) times daily.    . senna (SENOKOT) 8.6 MG TABS tablet Take 2 tablets by mouth at bedtime.    . thiamine (VITAMIN B-1) 100 MG tablet Take 100 mg by mouth daily.    . HYDROcodone-acetaminophen (NORCO/VICODIN) 5-325 MG per tablet Take 1 tablet by mouth every 6 (six) hours as needed for moderate pain.    . traMADol (ULTRAM) 50 MG tablet Take 50 mg by mouth every 6 (six) hours as needed.     No current facility-administered medications for this visit.     No Known Allergies   REVIEW   OF SYSTEMS:  (Positives checked otherwise negative)  CARDIOVASCULAR:  [] chest pain, [] chest pressure, [] palpitations, [] shortness of breath when laying flat, [] shortness of breath with exertion,  [x] pain in feet when walking, [x] pain in feet when laying flat, [] history of blood clot in veins (DVT), [] history of phlebitis, [] swelling in legs, [] varicose veins  PULMONARY:  [] productive cough, [] asthma, [] wheezing  NEUROLOGIC:  [] weakness in arms or legs, [] numbness in arms or legs, [] difficulty speaking or slurred speech, [] temporary loss of vision in one eye, [] dizziness  HEMATOLOGIC:  [] bleeding problems, [] problems with blood clotting too easily  MUSCULOSKEL:  [] joint pain, [] joint swelling  GASTROINTEST:  [] vomiting blood, [] blood in stool     GENITOURINARY:  [] burning with urination, [] blood in urine  PSYCHIATRIC:  [] history of major  depression  INTEGUMENTARY:  [] rashes, [x] ulcers  CONSTITUTIONAL:  [] fever, [] chills   For VQI Use Only  PRE-ADM LIVING: Nursing home  AMB STATUS: Wheelchair  CAD Sx: None  PRIOR CHF: None  STRESS TEST: [x] No, [ ] Normal, [ ] + ischemia, [ ] + MI, [ ] Both   Physical Examination  Filed Vitals:   09/14/14 1441  BP: 130/83  Pulse: 85  Resp: 18  Height: 5' 9.5" (1.765 m)  Weight: 152 lb (68.947 kg)   Body mass index is 22.13 kg/(m^2).  General: A&O x 3, WD, somewhat cachectic  Head: [place]/AT  Ear/Nose/Throat: Hearing grossly intact, nares w/o erythema or drainage, oropharynx w/o Erythema/Exudate, Mallampati score: 3  Eyes: PERRLA, EOMI  Neck: Supple, no nuchal rigidity, no palpable LAD  Pulmonary: Sym exp, good air movt, CTAB, no rales, rhonchi, & wheezing  Cardiac: RRR, Nl S1, S2, no Murmurs, rubs or gallops  Vascular: Vessel Right Left  Radial Palpable Palpable  Brachial Palpable Palpable  Carotid Palpable, without bruit Palpable, without bruit  Aorta Not palpable N/A  Femoral Faintly Palpable Not Palpable  Popliteal Not palpable Not palpable  PT Not Palpable Not Palpable  DP Not Palpable Not Palpable   Gastrointestinal: soft, NTND, -G/R, - HSM, - masses, - CVAT B  Musculoskeletal: M/S 5/5 in BUE, BLE test limited due to pain in L foot, Extremities without ischemic changes , L 3rd and 4th toe gangrene with some smell without frank purulent drainage, pain in L foot with manipulation  Neurologic: CN 2-12 intact , Pain and light touch intact in extremities except decreased in feet , Motor exam as listed above  Psychiatric: psychomotor delay, judgment appears to be intact, somewhat flat affect  Dermatologic: See M/S exam for extremity exam, no rashes otherwise noted  Lymph : No Cervical, Axillary, or Inguinal lymphadenopathy    Outside Studies/Documentation 1 pages of outside documents were reviewed including: outside arterial study.  Medical  Decision Making  Greg Davis is a 59 y.o. 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  His co-morbidities, however, may limit his options, especially if his liver's synthetic function is limited.  Will probably need to admit the patient tomorrow to Hospitalist service for medical optimization given known history of cirrhosis and physical exam appearance consistent with protein malnutrition along with diabetes  I discussed with the patient the natural   history of critical limb ischemia: 25% require amputation in one year, 50% are able to maintain their limbs in one year, and 25-30% die in one year due to comorbidities.  Given the limb threatening status of this patient, I recommend an aggressive work up including proceeding with an: Aortogram, Bilateral runoff and ? LLE intervention. I discussed with the patient the nature of angiographic procedures, especially the limited patencies of any endovascular intervention. The patient is aware of that the risks of an angiographic procedure include but are not limited to: bleeding, infection, access site complications, embolization, rupture of treated vessel, dissection, possible need for emergent surgical intervention, and possible need for surgical procedures to treat the patient's pathology. The patient is aware of the risks and agrees to proceed.  The procedure is scheduled for: tomorrow 09/15/14.  I discussed in depth with the patient the nature of atherosclerosis, and emphasized the importance of maximal medical management including strict control of blood pressure, blood glucose, and lipid levels, antiplatelet agents, obtaining regular exercise, and cessation of smoking.  The patient is aware that without maximal medical management the underlying atherosclerotic disease process will  progress, limiting the benefit of any interventions. The patient is currently not on a statin.  Lipid panel along with LFT and nutrition labs will be drawn tomorrow. The patient is currently on an anti-platelet: ASA. Pt will also need baseline BLE ABI at some point.  Thank you for allowing us to participate in this patient's care.  Brian Chen, MD [facility] Office: [phone] Pager: [phone]  09/14/2014, 3:15 PM     

## 2014-09-15 NOTE — Progress Notes (Signed)
PHARMACY NOTE  Pharmacy Consult :  59 y.o. male is currently on a Heparin infusion for atrial fibrillation .   Heparin Dosing Wt :  69 kg  LABS :  Recent Labs  09/15/14 0812 09/15/14 1000 09/15/14 2013  HGB 13.3 11.1*  --   HCT 39.0 33.0*  --   PLT  --  304  --   APTT  --  45*  --   LABPROT  --  15.8*  --   INR  --  1.25  --   HEPARINUNFRC  --   --  <0.10*  CREATININE 0.70 0.75  --     MEDICATION: Infusion[s]: Infusions:  . sodium chloride 75 mL/hr at 09/15/14 1745  . amiodarone  To be started  . heparin 1,100 Units/hr (09/15/14 1245)    ASSESSMENT :  59 y.o. male with atrial fibrillation is currently on a Heparin infusion per Pharmacy Consult.  At this time, the patient has gone back in afib.  Heparin infusing at 1100 units/hr.    Heparin level < 0.1 units/ml.  No evidence of bleeding complications observed.  GOAL :  Heparin Level  0.3 - 0.7 units/ml  PLAN : 1. Heparin bolus 2000 units IV now, then increase the Heparin infusion to 1300 units/hr.   2. Daily Heparin level, INR, CBC, and Monitor for bleeding complications.  Follow Platlet counts.  Velda ShellEarle Shyenne Maggard, Pharm.D    09/15/2014, 10:04 PM

## 2014-09-15 NOTE — ED Notes (Signed)
MD at bedside. 

## 2014-09-15 NOTE — Progress Notes (Addendum)
Called by RN that patient was back in a fib v SVT with BP 80s/60s. Ordered EKG and went to bedside. He complains of L leg pain but denies chest pain, SOB, palpitations, nausea.   T 98.9 BP 70s-100s/60s-70s HR 90s-140s RR 20 Sat 100% RA  Gen: Resting in bed, alert and oriented to name, location is hospital, mild pain CV: Tachycardic and irregularly irregular, no MRG Lung: CTAB, resp unlabored Ext: LLE bandaged  A/P: Patient is still on cardizem drip. He had converted to NSR in ED but is back now in atrial fibrillation. Ordered 500 cc bolus NS (no echo in record) and his rhythm now appears to be bouncing back and forth between atrial fibrillation and NSR.  -cont dilt gtt @ 10  -250 cc NS bolus -q15 min pressure -call cards  Farley LyAdam Kathaleya Mcduffee, MD Internal Medicine, PGY-1 Pager [phone] 09/15/2014, 7:58 PM   ADDENDUM: Sherron MondaySpoke to cards fellow. He recommends since he is going in and out of atrial fibrillation and NSR and BP he would recommend amiodarone load and infusion. While he has history of cirrhosis listed on problem list, his AST and ALT were wnl in the ED. He would continue the diltiazem gtt until he is back in NSR. He would bolus as needed.    Farley LyAdam Richy Spradley, MD Internal Medicine, PGY-1 Pager [phone] 09/15/2014, 8:13 PM   ADDENDUM: RN called that BP in the 70s/40s. Went to bedside and repeat BP was 80s/50s with MAP 65 and HR 80s still in atrial fibrillation. RN had stopped diltiazem gtt and will hold for now. Received faxed records which note ICM with EF 30-35%. Will continue to monitor.   Farley LyAdam Halaina Vanduzer, MD Internal Medicine, PGY-1 Pager [phone] 09/15/2014, 9:40 PM  ADDENDUM: Called by RN that MAP was in 50s x 2. Ordered 250 cc NS bolus. Unbandaged L foot for exam. It was warm and DP pulse audible on doppler. He could move toes. 3rd and 4th toes appear necrotic and are malodorous. R foot warm with palpable DP pulse. MAP now in 60s and appears to be in NSR. If worsens or  develops fever will consider antibiotics. Will discuss with day team whether he he needs [facility] v ortho.   Farley LyAdam Veleta Yamamoto, MD Internal Medicine, PGY-1 Pager [phone] 09/15/2014, 10:52 PM   ADDENDUM: Called by RN that patient has abdominal pain with dry heaving. He says he has had LUQ constant abdominal pain for days. He is nauseous and dry heaving without true emesis. He says he cannot remember when he last had bowel movement. On exam his MAP is 70 and abdomen is soft, tender on the L side upper>lower, non-distended, + BS, no organomegaly. He could be constipated. Concern for ischemia given a fib and hypotension through night but he is on heparin gtt already. LFTs were normal on admission. -abdominal x-ray -zofran 4 mg iv q6hprn -tylenol 650 mg pr once -cont to monitor   Farley LyAdam Thirza Pellicano, MD Internal Medicine, PGY-1 Pager [phone] 09/16/2014, 12:25 AM  ADDENDUM: RN notified that MAP in 50s x 2. No respiratory distress and HR in 80s. Will give additional 250 cc NS bolus.   Farley LyAdam Xaine Sansom, MD Internal Medicine, PGY-1 Pager [phone] 09/16/2014, 3:21 AM

## 2014-09-15 NOTE — ED Notes (Signed)
Portable xray at bedside.

## 2014-09-15 NOTE — Progress Notes (Signed)
UR Completed Jaymen Fetch Graves-Bigelow, RN,BSN [phone]  

## 2014-09-15 NOTE — H&P (Signed)
Date: 09/15/2014               Patient Name:  Greg Davis MRN: [id number]  DOB: [birth year] Age / Sex: 59 y.o., male   PCP: No primary care provider on file.         Medical Service: Internal Medicine Teaching Service         Attending Physician: Dr. Inez CatalinaEmily B Mullen, MD    First Contact: Dr. Isabella BowensKrall Pager: [phone]  Second Contact: Dr. Yetta BarreJones Pager: [phone]       After Hours (After 5p/  First Contact Pager: [phone]  weekends / holidays): Second Contact Pager: [phone]   Chief Complaint: A Fib with RVR  History of Present Illness: Mr. Greg Davis is a 59 year old man with history of T2DM, cirrhosis, alcohol dependence, L eye blindness presenting with a fib with RVR. He was evaluated by vascular surgery yesterday (Dr. Leonides SakeBrian Chen) for LLE critical limb ischemia with left foot gangrene. He presented for aortogram and upon being placed on the angiography table and he became tachycardic and tachypneic. He was found to be in a fib with RVR. Per ED note, he had denied history of prior arrhythmias.  In the ED, his heart rate went as high as 176, and he was normotensive. He was given 15mg  IV diltiazem, started on a diltiazem gtt, given 1.5L NS bolus, 25mg  metoprolol succinate, 1mg  IV ativan. He was started on a heparin gtt.  Poor historian presently - mumbling and tangential. He reported he had some nonradiating midsternal chest pain earlier. He denies fevers, chills, cough, shortness of breath, chest pain, palpitations, nausea/vomiting, abdominal pain currently.  Meds: Current Facility-Administered Medications  Medication Dose Route Frequency Provider Last Rate Last Dose  . diltiazem (CARDIZEM CD) 24 hr capsule 240 mg  240 mg Oral Daily Pricilla LovelessScott Goldston, MD   240 mg at 09/15/14 1206  . diltiazem (CARDIZEM) 100 mg in dextrose 5 % 100 mL (1 mg/mL) infusion  5-15 mg/hr Intravenous Continuous Pricilla LovelessScott Goldston, MD 10 mL/hr at 09/15/14 1031 10 mg/hr at 09/15/14 1031  . heparin ADULT infusion 100 units/mL  (25000 units/250 mL)  1,100 Units/hr Intravenous Continuous Bertram MillardMichael A Maccia, RPH 11 mL/hr at 09/15/14 1245 1,100 Units/hr at 09/15/14 1245  . metoprolol succinate (TOPROL-XL) 24 hr tablet 25 mg  25 mg Oral Daily Pricilla LovelessScott Goldston, MD   25 mg at 09/15/14 1151   Current Outpatient Prescriptions  Medication Sig Dispense Refill  . ARTIFICIAL TEAR OINTMENT OP Apply to eye 3 (three) times daily.    Marland Kitchen. aspirin 81 MG tablet Take 81 mg by mouth daily.    Marland Kitchen. diltiazem (TIAZAC) 240 MG 24 hr capsule Take 240 mg by mouth daily.    . famotidine (PEPCID) 20 MG tablet Take 20 mg by mouth 2 (two) times daily.    . folic acid (FOLVITE) 1 MG tablet Take 1 mg by mouth daily.    . metoprolol succinate (TOPROL-XL) 25 MG 24 hr tablet Take 25 mg by mouth daily. Take 1/2 tablet bid    . oxyCODONE (OXY IR/ROXICODONE) 5 MG immediate release tablet Take 5 mg by mouth every 4 (four) hours as needed for moderate pain or severe pain.    . Oxycodone HCl 10 MG TABS Take 10 mg by mouth daily. 10 mg daily prior to wound care  Per medication record from nursing home    . polyethylene glycol (MIRALAX / GLYCOLAX) packet Take 17 g by mouth daily.    . pregabalin (LYRICA) 50 MG  capsule Take 50 mg by mouth 2 (two) times daily.    Marland Kitchen senna (SENOKOT) 8.6 MG TABS tablet Take 2 tablets by mouth at bedtime.    . thiamine (VITAMIN B-1) 100 MG tablet Take 100 mg by mouth daily.    . traZODone (DESYREL) 100 MG tablet Take 100 mg by mouth at bedtime.      Allergies: Allergies as of 09/15/2014  . (No Known Allergies)   Past Medical History  Diagnosis Date  . Diabetes mellitus without complication   . Anemia   . Other cirrhosis of liver   . Acute respiratory failure, unspecified whether with hypoxia or hypercapnia   . Alcohol dependence with withdrawal   . Hypomagnesemia   . 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 Knee surgery    . Eye surgery     History  reviewed. No pertinent family history. History   Social History  . Marital Status: Single    Spouse Name: N/A  . Number of Children: N/A  . Years of Education: N/A   Occupational History  . Not on file.   Social History Main Topics  . Smoking status: Current Every Day Smoker -- 15 years    Types: Cigarettes  . Smokeless tobacco: Never Used  . Alcohol Use: 0.0 oz/week    0 Standard drinks or equivalent per week  . Drug Use: No  . Sexual Activity: Not on file   Other Topics Concern  . Not on file   Social History Narrative    Review of Systems: Unable to obtain 2/2 mental status.  Physical Exam: Blood pressure 108/66, pulse 93, temperature 98.1 F (36.7 C), temperature source Oral, resp. rate 20, height  (1.753 m), weight 152 lb (68.947 kg), SpO2 100 %. General Apperance: NAD Head: Normocephalic, atraumatic Eyes: R eye cloudy, L pupil reactive, EOMI, anicteric sclera Ears: Normal external ear canal Nose: Nares normal, septum midline, mucosa normal Throat: Lips, mucosa and tongue normal  Neck: Supple, trachea midline Back: No tenderness or bony abnormality  Lungs: Clear to auscultation bilaterally. No wheezes, rhonchi or rales. Breathing comfortably Chest Wall: Nontender, no deformity Heart: Irregularly irregular, no murmur/rub/gallop Abdomen: Soft, nontender, nondistended, no rebound/guarding Extremities: Warm and well perfused, no edema, L foot with dressing in place Neurologic: Somnolent but arousable and oriented x 2. CNII-XII intact. Hyperreflexive and rigid UE, otherwise grossly intact.  Lab results: Basic Metabolic Panel:  Recent Labs  16/10/96 0812 09/15/14 1000  NA 138 137  K 3.8 3.6  CL 101 103  CO2  --  25  GLUCOSE 100* 107*  BUN 12 10  CREATININE 0.70 0.75  CALCIUM  --  8.8*  MG  --  1.4*   Liver Function Tests:  Recent Labs  09/15/14 0804  AST 22  ALT 16*  ALKPHOS 84  BILITOT 0.9  PROT 7.9  ALBUMIN 3.2*   CBC:  Recent  Labs  09/15/14 0812 09/15/14 1000  WBC  --  12.1*  NEUTROABS  --  9.5*  HGB 13.3 11.1*  HCT 39.0 33.0*  MCV  --  90.2  PLT  --  304   Cardiac Enzymes: No results for input(s): CKTOTAL, CKMB, CKMBINDEX, TROPONINI in the last 72 hours.  Fasting Lipid Panel:  Recent Labs  09/15/14 0804  CHOL 120  HDL 28*  LDLCALC 77  TRIG 75  CHOLHDL 4.3   Thyroid Function Tests: No results  for input(s): TSH, T4TOTAL, FREET4, T3FREE, THYROIDAB in the last 72 hours.  Anemia Panel: No results for input(s): VITAMINB12, FOLATE, FERRITIN, TIBC, IRON, RETICCTPCT in the last 72 hours.   Coagulation:  Recent Labs  09/15/14 1000  LABPROT 15.8*  INR 1.25   Urine Drug Screen: Drugs of Abuse  No results found for: LABOPIA, COCAINSCRNUR, LABBENZ, AMPHETMU, THCU, LABBARB   Alcohol Level: No results for input(s): ETH in the last 72 hours.  Urinalysis: No results for input(s): COLORURINE, LABSPEC, PHURINE, GLUCOSEU, HGBUR, BILIRUBINUR, KETONESUR, PROTEINUR, UROBILINOGEN, NITRITE, LEUKOCYTESUR in the last 72 hours.  Invalid input(s): APPERANCEUR   Imaging results:  Dg Chest Port 1 View  09/15/2014   CLINICAL DATA:  Tachycardia.  EXAM: PORTABLE CHEST - 1 VIEW  COMPARISON:  November 19, 2013.  FINDINGS: The heart size and mediastinal contours are within normal limits. Both lungs are clear. No pneumothorax or pleural effusion is noted. Old left-sided rib fractures are noted. Degenerative change of left glenohumeral joint is noted.  IMPRESSION: No acute cardiopulmonary abnormality seen.   Electronically Signed   By: Lupita Raider, M.D.   On: 09/15/2014 11:25    Other results: EKG: atrial fibrillation with RVR, no previous EKG for comparison  Assessment & Plan by Problem: Active Problems:   Atrial fibrillation with RVR  Atrial fibrillation with RVR: Repeat EKG with sinus rhythm. Chest XR with no acute abnormality. On diltiazem  24hr daily, metoprolol succinate 12.5mg  BID at home. -Cardiology  following, appreciate recommendations -Continue home diltiazem  24hr daily, metoprolol succinate 12.5mg  BID -Heparin gtt -Repleting mag -Check TSH -Trend troponins -Echo -Telemetry  Acute encephalopathy: Ativan given at 10:45AM. Cardiology evaluated at 11:30AM and noted mental status changes. He was started on heparin gtt at 12:45AM. Presently he has UE rigidity and hyperreflexia. Concern for CVA 2/2 embolus. Unlikely to be hemorrhagic as heparin was started after the mental status changes. If negative to CVA, likely 2/2 Ativan. -CT head without contrast  Left Foot Gangrene: 3 months of left foot pain with ischemic left toes. Undergoing evaluation by vascular surgery. Leukocytosis of 12.1 but afebrile.  -Per vascular surgery: If medically stable overnight, could consider aortogram, bilateral runoff, ?L leg intervention tomorrow -continue aspirin  daily  Chronic anemia: Hgb on admission 11.1 with MCV wnl.  -continue to monitor  T2DM: on Lyrica  BID at home. No medications for DM. -Lyrica  BID held  Cirrhosis: Albumin 3.2, T bili, INR wnl. Child Class A. -Continue to monitor  Alcohol dependence: presently living at [facility]. -Home folic acid  daily and thiamine  daily held  FEN:  -NPO -Hypomagnesemia: replete with 2g IV MgS  VTE ppx: Heparin gtt  Dispo: Disposition is deferred at this time, awaiting improvement of current medical problems. Anticipated discharge in approximately 1-2 day(s).   The patient does not have a current PCP (No primary care provider on file.) and does not need an [facility] hospital follow-up appointment after discharge.  The patient does not have transportation limitations that hinder transportation to clinic appointments.  Signed: Lora Paula, MD PGY-1 09/15/2014, 12:53 PM

## 2014-09-15 NOTE — Progress Notes (Signed)
   Daily Progress Note  Pt was placed on angiography table and HR went into 150-170s.  Waveforms look to be ST vs aflutter.  Pt also with tachypnea.  Procedure was canceled.  Will send pt to ED for further work-up.  Suspect patient will need Hospitalist admission and cardiac work-up.  - if pt is medical stable overnight, could consider doing the Ao, BRo, ? L leg intervention tomorrow.  Deke Tilghman, MD [facility] [place] Office: [phone] Pager: [phone]  09/15/2014, 9:13 AM      

## 2014-09-15 NOTE — ED Notes (Signed)
MD at bedside. - cards 

## 2014-09-15 NOTE — Progress Notes (Signed)
Received [facility], given to Dr. Virgina OrganQureshi.  Colman Caterarpley, Maelyn Berrey Danielle

## 2014-09-16 ENCOUNTER — Inpatient Hospital Stay (HOSPITAL_COMMUNITY): Payer: Medicaid Other

## 2014-09-16 ENCOUNTER — Encounter (HOSPITAL_COMMUNITY)
Admission: EM | Disposition: A | Discharge status: Skilled Nursing Facility | Payer: Medicaid Other | Source: Home / Self Care | Attending: Internal Medicine

## 2014-09-16 ENCOUNTER — Encounter (HOSPITAL_COMMUNITY): Payer: Self-pay | Admitting: Vascular Surgery

## 2014-09-16 DIAGNOSIS — I4891 Unspecified atrial fibrillation: Secondary | ICD-10-CM

## 2014-09-16 DIAGNOSIS — I70262 Atherosclerosis of native arteries of extremities with gangrene, left leg: Secondary | ICD-10-CM

## 2014-09-16 DIAGNOSIS — E876 Hypokalemia: Secondary | ICD-10-CM

## 2014-09-16 DIAGNOSIS — R1011 Right upper quadrant pain: Secondary | ICD-10-CM

## 2014-09-16 HISTORY — PX: ABDOMINAL AORTAGRAM: SHX5454

## 2014-09-16 HISTORY — PX: LOWER EXTREMITY ANGIOGRAM: SHX5508

## 2014-09-16 LAB — LACTIC ACID, PLASMA: LACTIC ACID, VENOUS: 1.1 mmol/L (ref 0.5–2.0)

## 2014-09-16 LAB — BASIC METABOLIC PANEL
Anion gap: 11 (ref 5–15)
BUN: 7 mg/dL (ref 6–20)
CHLORIDE: 103 mmol/L (ref 101–111)
CO2: 19 mmol/L — ABNORMAL LOW (ref 22–32)
Calcium: 7.9 mg/dL — ABNORMAL LOW (ref 8.9–10.3)
Creatinine, Ser: 0.64 mg/dL (ref 0.61–1.24)
GFR calc Af Amer: >60 mL/min (ref >60)
GFR calc non Af Amer: >60 mL/min (ref >60)
Glucose, Bld: 83 mg/dL (ref 65–99)
Potassium: 3.4 mmol/L — ABNORMAL LOW (ref 3.5–5.1)
SODIUM: 133 mmol/L — AB (ref 135–145)

## 2014-09-16 LAB — GLUCOSE, CAPILLARY
GLUCOSE-CAPILLARY: 87 mg/dL (ref 65–99)
GLUCOSE-CAPILLARY: 94 mg/dL (ref 65–99)
Glucose-Capillary: 87 mg/dL (ref 65–99)
Glucose-Capillary: 85 mg/dL (ref 65–99)
Glucose-Capillary: 76 mg/dL (ref 65–99)

## 2014-09-16 LAB — HEPARIN LEVEL (UNFRACTIONATED): Heparin Unfractionated: 0.1 IU/mL — ABNORMAL LOW (ref 0.30–0.70)

## 2014-09-16 LAB — TSH: TSH: 0.781 u[IU]/mL (ref 0.350–4.500)

## 2014-09-16 LAB — CBC
HCT: 29.6 % — ABNORMAL LOW (ref 39.0–52.0)
HEMOGLOBIN: 10 g/dL — AB (ref 13.0–17.0)
MCH: 30.1 pg (ref 26.0–34.0)
MCHC: 33.8 g/dL (ref 30.0–36.0)
MCV: 89.2 fL (ref 78.0–100.0)
Platelets: 277 10*3/uL (ref 150–400)
RBC: 3.32 MIL/uL — AB (ref 4.22–5.81)
RDW: 12.5 % (ref 11.5–15.5)
WBC: 7.9 10*3/uL (ref 4.0–10.5)

## 2014-09-16 LAB — TROPONIN I

## 2014-09-16 LAB — MAGNESIUM: Magnesium: 1.4 mg/dL — ABNORMAL LOW (ref 1.7–2.4)

## 2014-09-16 SURGERY — ABDOMINAL AORTAGRAM
Laterality: Bilateral

## 2014-09-16 MED ORDER — ACETAMINOPHEN 650 MG RE SUPP: 650.0000 mg | Freq: Once | RECTAL | Status: DC

## 2014-09-16 MED ORDER — HEPARIN (PORCINE) IN NACL 2-0.9 UNIT/ML-% IJ SOLN
INTRAMUSCULAR | Status: AC
  Filled 2014-09-16: qty 1000

## 2014-09-16 MED ORDER — FENTANYL CITRATE (PF) 100 MCG/2ML IJ SOLN
INTRAMUSCULAR | Status: AC
  Filled 2014-09-16: qty 2

## 2014-09-16 MED ORDER — ATORVASTATIN CALCIUM 80 MG PO TABS
80.0000 mg | ORAL_TABLET | Freq: Every day | ORAL | Status: DC
  Administered 2014-09-16 – 2014-09-21 (×6): 80 mg via ORAL
  Filled 2014-09-16 (×6): qty 1

## 2014-09-16 MED ORDER — FENTANYL CITRATE (PF) 100 MCG/2ML IJ SOLN
INTRAMUSCULAR | Status: DC | PRN
  Administered 2014-09-16 (×2): 25 ug via INTRAVENOUS

## 2014-09-16 MED ORDER — VANCOMYCIN HCL IN DEXTROSE 1-5 GM/200ML-% IV SOLN
1000.0000 mg | Freq: Two times a day (BID) | INTRAVENOUS | Status: DC
  Administered 2014-09-16 – 2014-09-19 (×8): 1000 mg via INTRAVENOUS
  Filled 2014-09-16 (×10): qty 200

## 2014-09-16 MED ORDER — IODIXANOL 320 MG/ML IV SOLN
INTRAVENOUS | Status: DC | PRN
  Administered 2014-09-16: 170 mL via INTRAVENOUS

## 2014-09-16 MED ORDER — ONDANSETRON HCL 4 MG/2ML IJ SOLN
4.0000 mg | Freq: Four times a day (QID) | INTRAMUSCULAR | Status: DC | PRN
  Administered 2014-09-16 (×2): 4 mg via INTRAVENOUS
  Filled 2014-09-16 (×2): qty 2

## 2014-09-16 MED ORDER — MORPHINE SULFATE 2 MG/ML IJ SOLN
1.0000 mg | INTRAMUSCULAR | Status: AC | PRN
  Administered 2014-09-16 – 2014-09-17 (×3): 1 mg via INTRAVENOUS
  Filled 2014-09-16 (×3): qty 1

## 2014-09-16 MED ORDER — POTASSIUM CHLORIDE CRYS ER 20 MEQ PO TBCR
40.0000 meq | EXTENDED_RELEASE_TABLET | Freq: Once | ORAL | Status: AC
  Administered 2014-09-16: 40 meq via ORAL
  Filled 2014-09-16: qty 2

## 2014-09-16 MED ORDER — MAGNESIUM SULFATE 4 GM/100ML IV SOLN
4.0000 g | Freq: Once | INTRAVENOUS | Status: AC
  Administered 2014-09-16: 4 g via INTRAVENOUS
  Filled 2014-09-16: qty 100

## 2014-09-16 MED ORDER — HEPARIN (PORCINE) IN NACL 100-0.45 UNIT/ML-% IJ SOLN
1750.0000 [IU]/h | INTRAMUSCULAR | Status: DC
  Administered 2014-09-16: 1500 [IU]/h via INTRAVENOUS
  Administered 2014-09-17 – 2014-09-18 (×3): 1750 [IU]/h via INTRAVENOUS
  Filled 2014-09-16 (×4): qty 250

## 2014-09-16 MED ORDER — LIDOCAINE HCL (PF) 1 % IJ SOLN
INTRAMUSCULAR | Status: AC
  Filled 2014-09-16: qty 30

## 2014-09-16 MED ORDER — HYDROCODONE-ACETAMINOPHEN 5-325 MG PO TABS
1.0000 | ORAL_TABLET | Freq: Four times a day (QID) | ORAL | Status: DC | PRN
  Administered 2014-09-16: 2 via ORAL
  Administered 2014-09-16: 1 via ORAL
  Administered 2014-09-17 (×2): 2 via ORAL
  Filled 2014-09-16 (×3): qty 2
  Filled 2014-09-16: qty 1

## 2014-09-16 MED ORDER — HEPARIN BOLUS VIA INFUSION
2000.0000 [IU] | Freq: Once | INTRAVENOUS | Status: AC
  Administered 2014-09-16: 2000 [IU] via INTRAVENOUS
  Filled 2014-09-16: qty 2000

## 2014-09-16 MED ORDER — SODIUM CHLORIDE 0.9 % IV BOLUS (SEPSIS)
250.0000 mL | Freq: Once | INTRAVENOUS | Status: AC
  Administered 2014-09-16: 250 mL via INTRAVENOUS

## 2014-09-16 MED ORDER — KETOROLAC TROMETHAMINE 30 MG/ML IJ SOLN: 15.0000 mg | Freq: Four times a day (QID) | INTRAMUSCULAR | Status: DC | PRN

## 2014-09-16 MED FILL — Heparin Sodium (Porcine) 2 Unit/ML in Sodium Chloride 0.9%: INTRAMUSCULAR | Qty: 1000 | Status: AC

## 2014-09-16 MED FILL — Lidocaine HCl Local Preservative Free (PF) Inj 1%: INTRAMUSCULAR | Qty: 30 | Status: AC

## 2014-09-16 SURGICAL SUPPLY — 11 items
CATH ANGIO 5F PIGTAIL 65CM (CATHETERS) ×2 IMPLANT
CATH CROSS OVER TEMPO 5F (CATHETERS) ×2 IMPLANT
CATH STRAIGHT 5FR 65CM (CATHETERS) ×2 IMPLANT
COVER PRB 48X5XTLSCP FOLD TPE (BAG) ×1 IMPLANT
COVER PROBE 5X48 (BAG) ×4
KIT PV (KITS) ×4 IMPLANT
SHEATH PINNACLE 5F 10CM (SHEATH) ×2 IMPLANT
SYR MEDRAD MARK V 150ML (SYRINGE) ×2 IMPLANT
TRANSDUCER W/STOPCOCK (MISCELLANEOUS) ×4 IMPLANT
TRAY PV CATH (CUSTOM PROCEDURE TRAY) ×4 IMPLANT
WIRE HITORQ VERSACORE ST 145CM (WIRE) ×4 IMPLANT

## 2014-09-16 NOTE — Progress Notes (Signed)
ANTICOAGULATION CONSULT NOTE - Follow-Up Pharmacy Consult for Heparin  Indication: atrial fibrillation  No Known Allergies  Patient Measurements: Height: 5' 9.5" (176.5 cm) Weight: 146 lb 3.2 oz (66.316 kg) IBW/kg (Calculated) : 71.85  Vital Signs: Temp: 97.7 F (36.5 C) (05/27 1209) Temp Source: Oral (05/27 0745) BP: 123/86 mmHg (05/27 1136) Pulse Rate: 88 (05/27 1136)  Labs:  Recent Labs  09/15/14 0812 09/15/14 1000 09/15/14 1715 09/15/14 2013 09/15/14 2157 09/16/14 0504 09/16/14 0505 09/16/14 0814  HGB 13.3 11.1*  --   --   --   --   --  10.0*  HCT 39.0 33.0*  --   --   --   --   --  29.6*  PLT  --  304  --   --   --   --   --  277  APTT  --  45*  --   --   --   --   --   --   LABPROT  --  15.8*  --   --   --   --   --   --   INR  --  1.25  --   --   --   --   --   --   HEPARINUNFRC  --   --   --  <0.10*  --   --  0.10*  --   CREATININE 0.70 0.75  --   --   --  0.64  --   --   TROPONINI  --   --  0.04*  --  <0.03 <0.03  --   --     Estimated Creatinine Clearance: 94.4 mL/min (by C-G formula based on Cr of 0.64).  Assessment: 59 y.o. male presented to ED with afib with RVR.  Pt was started on heparin 5/26 but had not yet been therapeutic.  Heparin was stopped today for aortogram.  Paged Dr. Imogene Burnhen and received orders to restart 6 hours after sheath pull (done at 1330 today).  Will restart heparin at 1930 tonight.  Of note, per cards, patient is not likely a good long term anticoagulation candidate due to ongoing EtOH use.  Goal of Therapy:  Heparin level 0.3-0.7 units/ml Monitor platelets by anticoagulation protocol: Yes   Plan:  Restart heparin at 1500 units/hr at 1930 tonight. Heparin level in 8 hours (0330 5/28) Daily heparin level and CBC  Greg Davis, Pharm.D., BCPS Clinical Pharmacist Pager [phone] 09/16/2014 4:19 PM

## 2014-09-16 NOTE — H&P (View-Only) (Signed)
   Daily Progress Note  Pt was placed on angiography table and HR went into 150-170s.  Waveforms look to be ST vs aflutter.  Pt also with tachypnea.  Procedure was canceled.  Will send pt to ED for further work-up.  Suspect patient will need Hospitalist admission and cardiac work-up.  - if pt is medical stable overnight, could consider doing the Ao, BRo, ? L leg intervention tomorrow.  Leonides SakeBrian Amariyana Heacox, MD [facility] [place] Office: [phone] Pager: [phone]  09/15/2014, 9:13 AM

## 2014-09-16 NOTE — Progress Notes (Signed)
      BP 90's / 50's for the past 2-hours  Heart 84 bpm SR  He is on IV diltiazem for rate control and IV heparin for atrial fibrillation.    COLLINS, EMMA MAUREEN PA-C

## 2014-09-16 NOTE — Progress Notes (Signed)
BP 76/44 paged Dr. Valentino Saxonothman and ordered to stop Cardizem drip and keep fluids on at 18700ml/hr.

## 2014-09-16 NOTE — Progress Notes (Signed)
Subjective: Went back into a fib with RVR with hypotension last night. Discontinued diltiazem gtt and started on amiodarone. Received fluid boluses with response in BP. This morning, he reports he does not remember the events of the day prior. He denies CP presently but had some last night. Denies SOB.  Objective: Vital signs in last 24 hours: Filed Vitals:   09/16/14 0345 09/16/14 0510 09/16/14 0545 09/16/14 0745  BP: 110/60 107/70 98/51   Pulse: 87 87 87   Temp:   98.8 F (37.1 C) 99 F (37.2 C)  TempSrc:    Oral  Resp: 19 15 18    Height:      Weight:   146 lb 3.2 oz (66.316 kg)   SpO2: 99% 95% 97%    Weight change:   Intake/Output Summary (Last 24 hours) at 09/16/14 1112 Last data filed at 09/16/14 0954  Gross per 24 hour  Intake      0 ml  Output   2250 ml  Net  -2250 ml  General Apperance: NAD HEENT: Normocephalic, atraumatic, R eye cloudy, L pupil reactive, EOMI, anicteric sclera Neck: Supple, trachea midline Lungs: Clear to auscultation bilaterally. No wheezes, rhonchi or rales. Breathing comfortably Heart: RRR, no murmur/rub/gallop Abdomen: Soft, tender in epigastric/RUQ, nondistended, no rebound/guarding Extremities: L foot with dressing in place Neurologic: Alert and follows commands. No aphasia. Grossly intact.  Lab Results: Basic Metabolic Panel:  Recent Labs Lab 09/15/14 1000 09/16/14 0504  NA 137 133*  K 3.6 3.4*  CL 103 103  CO2 25 19*  GLUCOSE 107* 83  BUN 10 7  CREATININE 0.75 0.64  CALCIUM 8.8* 7.9*  MG 1.4* 1.4*   Liver Function Tests:  Recent Labs Lab 09/15/14 0804  AST 22  ALT 16*  ALKPHOS 84  BILITOT 0.9  PROT 7.9  ALBUMIN 3.2*   CBC:  Recent Labs Lab 09/15/14 1000 09/16/14 0814  WBC 12.1* 7.9  NEUTROABS 9.5*  --   HGB 11.1* 10.0*  HCT 33.0* 29.6*  MCV 90.2 89.2  PLT 304 277   Cardiac Enzymes:  Recent Labs Lab 09/15/14 1715 09/15/14 2157 09/16/14 0504  TROPONINI 0.04* <0.03 <0.03   CBG:  Recent Labs Lab  09/15/14 1642 09/15/14 2009 09/16/14 0033 09/16/14 0410 09/16/14 0758  GLUCAP 86 84 94 87 87   Hemoglobin A1C: No results for input(s): HGBA1C in the last 168 hours.   Fasting Lipid Panel:  Recent Labs Lab 09/15/14 0804  CHOL 120  HDL 28*  LDLCALC 77  TRIG 75  CHOLHDL 4.3   Thyroid Function Tests:  Recent Labs Lab 09/16/14 0504  TSH 0.781   Coagulation:  Recent Labs Lab 09/15/14 1000  LABPROT 15.8*  INR 1.25   Micro Results: No results found for this or any previous visit (from the past 240 hour(s)). Studies/Results: Ct Head Wo Contrast  09/15/2014   CLINICAL DATA:  Confusion, acute encephalopathy  EXAM: CT HEAD WITHOUT CONTRAST  TECHNIQUE: Contiguous axial images were obtained from the base of the skull through the vertex without intravenous contrast.  COMPARISON:  05/27/2013  FINDINGS: No skull fracture are noted. Stable lytic and sclerotic calvarial foci. 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 No intracranial hemorrhage, mass effect or midline shift. Ventricular size is stable from prior exam. No acute cortical infarction. Atherosclerotic calcifications of carotid siphon again noted. No mass lesion is noted on this unenhanced scan.  IMPRESSION: No acute intracranial abnormality. Stable mild cerebral atrophy. Stable chronic findings as described  Electronically Signed   By: Natasha Mead M.D.   On: 09/15/2014 16:04   Dg Chest Port 1 View  09/15/2014   CLINICAL DATA:  Tachycardia.  EXAM: PORTABLE CHEST - 1 VIEW  COMPARISON:  November 19, 2013.  FINDINGS: The heart size and mediastinal contours are within normal limits. Both lungs are clear. No pneumothorax or pleural effusion is noted. Old left-sided rib fractures are noted. Degenerative change of left glenohumeral joint is noted.  IMPRESSION: No acute cardiopulmonary abnormality seen.   Electronically Signed   By: Lupita Raider, M.D.   On: 09/15/2014 11:25   Dg Abd Portable  1v  09/16/2014   CLINICAL DATA:  Left upper quadrant abdominal pain.  EXAM: PORTABLE ABDOMEN - 1 VIEW  COMPARISON:  None.  FINDINGS: Paucity of bowel gas without definite evidence of obstruction.  Nondiagnostic evaluation for pneumoperitoneum secondary supine positioning exclusion a lower thorax. No definite pneumatosis or portal venous gas.  Ill-defined calcifications overlying the right upper abdominal quadrant may represent gallstones.  Vascular calcifications overlie the lower pelvis bilaterally.  No acute osseus abnormalities.  IMPRESSION: 1. Paucity of bowel gas without evidence of obstruction. 2. Ill-defined calcifications overlying the right upper quadrant may represent gallstones. Further evaluation with right upper quadrant abdominal ultrasound could be performed as clinically indicated   Electronically Signed   By: Simonne Come M.D.   On: 09/16/2014 01:45   Medications: I have reviewed the patient's current medications. Scheduled Meds: . acetaminophen  650 mg Rectal Once  . aspirin  81 mg Oral Daily  . insulin aspart  0-9 Units Subcutaneous 6 times per day  . metoprolol succinate  25 mg Oral Daily  . potassium chloride  40 mEq Oral Once  . sodium chloride  3 mL Intravenous Q12H  . vancomycin  1,000 mg Intravenous Q12H   Continuous Infusions: . amiodarone 30 mg/hr (09/16/14 0352)  . heparin 1,500 Units/hr (09/16/14 0555)   PRN Meds:.ondansetron (ZOFRAN) IV Assessment/Plan: Atrial fibrillation with RVR: Per records from OSH, history of paroxysmal atrial fibrillation, ischemic cardiomyopathy with ejection fraction of 30-35%. In NSR this morning. TSH 0.781. Troponins negative x 3. Some hypotension last night that responded with IV fluids. -Cardiology following, appreciate recommendations -D/c home diltiazem  24hr daily -Continue metoprolol succinate 12.5mg  BID -amiodarone gtt -Heparin gtt -Echo pending -Telemetry  Abdominal pain: RUQ abdominal pain. Lactic acid 1.1. No  leukocytosis. RUQ ultrasound with cholelithiasis and no evidence of acute cholecystitis. -Continue to monitor  Acute encephalopathy: Per records from outside hospital, history of alcohol related dementia. CT head without contrast with no acute intracranial abnormality. Improved this morning.  Left Foot Gangrene: 3 months of left foot pain with ischemic left toes. Undergoing evaluation by vascular surgery.  -Vascular surgery following, appreciate recommendations -aortogram, bilateral runoff, ?L leg intervention  -continue aspirin  daily -IV vancomycin started -Blood cultures x2 -start atorvastatin  QHS  Chronic anemia: Per records from OSH, history of anemia of chronic disease secondary to alcohol. Hgb on admission 11.1 with MCV wnl.  -continue to monitor  T2DM: on Lyrica  BID at home. No medications for DM. -Lyrica  BID held -Hemoglobin A1c pending  Cirrhosis: Transaminases within normal limits at admission. Albumin 3.2, T bili, INR wnl. Child Class A. -Continue to monitor  Alcohol dependence: presently living at [facility]. -Home folic acid  daily and thiamine  daily held  FEN:  -NPO -Hypomagnesemia: replete with 4g IV MgS -Hypokalemia: Replete with 40 mEq by mouth KCl  VTE ppx: Heparin gtt  Dispo: Disposition is deferred at this time, awaiting improvement of current medical problems.  Anticipated discharge in approximately 2-3 day(s).   The patient does have a current PCP (No primary care provider on file.) and does not need an [facility] hospital follow-up appointment after discharge.  The patient does not have transportation limitations that hinder transportation to clinic appointments.  .Services Needed at time of discharge: Y = Yes, Blank = No PT:   OT:   RN:   Equipment:   Other:     LOS: 1 day   Lora Paula, MD 09/16/2014, 11:12 AM

## 2014-09-16 NOTE — Progress Notes (Signed)
BP 83/44(54), patient sleeping, Dr. Valentino Saxonothman paged, 250cc bolus ordered.

## 2014-09-16 NOTE — Progress Notes (Signed)
ANTICOAGULATION CONSULT NOTE Pharmacy Consult for Heparin  Indication: atrial fibrillation  No Known Allergies  Patient Measurements: Height: 5' 9.5" (176.5 cm) Weight: 149 lb 7.6 oz (67.8 kg) IBW/kg (Calculated) : 71.85  Vital Signs: Temp: 99.6 F (37.6 C) (05/26 2230) Temp Source: Oral (05/26 2230) BP: 107/70 mmHg (05/27 0510) Pulse Rate: 87 (05/27 0510)  Labs:  Recent Labs  09/15/14 0812 09/15/14 1000 09/15/14 1715 09/15/14 2013 09/15/14 2157 09/16/14 0505  HGB 13.3 11.1*  --   --   --   --   HCT 39.0 33.0*  --   --   --   --   PLT  --  304  --   --   --   --   APTT  --  45*  --   --   --   --   LABPROT  --  15.8*  --   --   --   --   INR  --  1.25  --   --   --   --   HEPARINUNFRC  --   --   --  <0.10*  --  0.10*  CREATININE 0.70 0.75  --   --   --   --   TROPONINI  --   --  0.04*  --  <0.03  --     Estimated Creatinine Clearance: 96.5 mL/min (by C-G formula based on Cr of 0.75).  Assessment: 59 y.o. male with Afib for heparin  Goal of Therapy:  Heparin level 0.3-0.7 units/ml Monitor platelets by anticoagulation protocol: Yes   Plan:  Heparin 2000 units IV bolus, then increase heparin 1500 units/hr Check heparin level in 6 hours.    Geannie RisenGreg Osmara Drummonds, PharmD, BCPS

## 2014-09-16 NOTE — Progress Notes (Signed)
Dr. Valentino Saxonothman and Virgina OrganQureshi and at bedside. Cardizem remains off. They would like to keep MAP above 60 and HR below 110. I will continue to monitor and page them as needed.

## 2014-09-16 NOTE — Clinical Social Work Note (Signed)
CSW confirmed with [facility] admissions liaison, patient is a long-term resident at facility and able to return once medically stable.  Full assessment to follow. FL2 to be placed on patient's chart for MD signature.  Marcelline Deistmily Saphronia Ozdemir, [place] - [phone] Clinical Social Work Department Orthopedics [id number]) and Surgical [id number]([id number])

## 2014-09-16 NOTE — Progress Notes (Signed)
   Daily Progress Note  Angiogram reviewed.  Pt has extensive L tibial disease.  Only target distally is a miniscule PT.  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    - Given the patient's poor protein synthetic function, I favor L AKA for mgmt of this patient's L foot gangrene - Pt does not want any amputation at this point. - Would get Palliative Care's input - I have some concerns with his cognitive function at this point, so I'll check on him on Monday to see if he has changed his mind. - From my viewpoint, patient does not need to stay in the hospital.   Leonides SakeBrian Lekendrick Alpern, MD [facility] Office: [phone] Pager: [phone]  09/16/2014, 3:51 PM

## 2014-09-16 NOTE — Interval H&P Note (Signed)
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 After consideration of risks, benefits and other options for treatment, the patient has consented to  Procedure(s): Abdominal Aortogram w/Lower Extremity (N/A) as a surgical intervention .  The patient's history has been reviewed, patient examined, no change in status, stable for surgery.  I have reviewed the patient's chart and labs.  Questions were answered to the patient's satisfaction.     Fabienne BrunsFields, Ferrah Panagopoulos

## 2014-09-16 NOTE — Progress Notes (Addendum)
    Subjective:  Denies CP or dyspnea   Objective:  Filed Vitals:   09/16/14 0345 09/16/14 0510 09/16/14 0545 09/16/14 0745  BP: 110/60 107/70 98/51   Pulse: 87 87 87   Temp:   98.8 F (37.1 C) 99 F (37.2 C)  TempSrc:    Oral  Resp: 19 15 18    Height:      Weight:   146 lb 3.2 oz (66.316 kg)   SpO2: 99% 95% 97%     Intake/Output from previous day:  Intake/Output Summary (Last 24 hours) at 09/16/14 1013 Last data filed at 09/16/14 0954  Gross per 24 hour  Intake      0 ml  Output   2250 ml  Net  -2250 ml    Physical Exam: Physical exam: Well-developed chronically ill appearing in no acute distress.  Skin is warm and dry.  HEENT is normal.  Neck is supple.  Chest is clear to auscultation with normal expansion.  Cardiovascular exam is regular rate and rhythm.  Abdominal exam nontender or distended. No masses palpated. Extremities show no edema. Ischemic left foot neuro grossly intact    Lab Results: Basic Metabolic Panel:  Recent Labs  16/01/9604/26/16 1000 09/16/14 0504  NA 137 133*  K 3.6 3.4*  CL 103 103  CO2 25 19*  GLUCOSE 107* 83  BUN 10 7  CREATININE 0.75 0.64  CALCIUM 8.8* 7.9*  MG 1.4* 1.4*   CBC:  Recent Labs  09/15/14 1000 09/16/14 0814  WBC 12.1* 7.9  NEUTROABS 9.5*  --   HGB 11.1* 10.0*  HCT 33.0* 29.6*  MCV 90.2 89.2  PLT 304 277   Cardiac Enzymes:  Recent Labs  09/15/14 1715 09/15/14 2157 09/16/14 0504  TROPONINI 0.04* <0.03 <0.03     Assessment/Plan:  1. Atrial fibrillation with rapid ventricular response, now sinus 2. Gangrene of toes of left foot 3. Malnutrition 4. Diabetes mellitus 5. Past history of alcohol excess rule out withdrawal 6. Hypokalemia  Recommendation: patient has converted to sinus rhythm. Continue IV amiodarone today. Transition to oral form tomorrow. Patient's blood pressure is borderline. Discontinue Cardizem. Continue Toprol. Chads vasc*score would be at least 2 for diabetes and vascular  disease. Will continue aspirin and heparin. However given alcohol abuse I do not think he would be a good long-term candidate for anticoagulation. Check echocardiogram. No TSH normal. Patient may proceed with arteriogram for ischemic left foot. If surgical revascularization required he would likely need nuclear study preoperatively. Follow hemoglobin closely as it has decreased since admission. No active bleeding. Supplement potassium for hypokalemia.  Olga MillersBrian Crenshaw 09/16/2014, 10:13 AM

## 2014-09-16 NOTE — Progress Notes (Signed)
ANTIBIOTIC CONSULT NOTE - INITIAL  Pharmacy Consult for Vancomycin Indication: wound infection  No Known Allergies  Patient Measurements: Height: 5' 9.5" (176.5 cm) Weight: 146 lb 3.2 oz (66.316 kg) IBW/kg (Calculated) : 71.85 Adjusted Body Weight:   Vital Signs: Temp: 99 F (37.2 C) (05/27 0745) Temp Source: Oral (05/27 0745) BP: 98/51 mmHg (05/27 0545) Pulse Rate: 87 (05/27 0545) Intake/Output from previous day: 05/26 0701 - 05/27 0700 In: 0  Out: 1925 [Urine:1925] Intake/Output from this shift:    Labs:  Recent Labs  09/15/14 0812 09/15/14 1000 09/16/14 0504 09/16/14 0814  WBC  --  12.1*  --  7.9  HGB 13.3 11.1*  --  10.0*  PLT  --  304  --  277  CREATININE 0.70 0.75 0.64  --    Estimated Creatinine Clearance: 94.4 mL/min (by C-G formula based on Cr of 0.64).  Microbiology: No results found for this or any previous visit (from the past 720 hour(s)).  Medical History: Past Medical History  Diagnosis Date  . Diabetes mellitus without complication   . Anemia   . Other cirrhosis of liver   . Acute respiratory failure, unspecified whether with hypoxia or hypercapnia   . Alcohol dependence with withdrawal   . Hypomagnesemia   . 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 acetaminophen  650 mg Rectal Once  . aspirin  81 mg Oral Daily  . diltiazem  240 mg Oral Daily  . insulin aspart  0-9 Units Subcutaneous 6 times per day  . metoprolol succinate  25 mg Oral Daily  . sodium chloride  3 mL Intravenous Q12H   Infusions:  . amiodarone 30 mg/hr (09/16/14 0352)  . heparin 1,500 Units/hr (09/16/14 0555)   Assessment: [name] with 3 month history of L foot pain, ischemia, and wound drainage.  Pt was previously scheduled for an aortogram 5/26 which was postponed when the patient was found to be in afib.  No noted significant changes to chronic wounds.  Patient is afebrile, WBC wnl.  Rx to start Vancomycin for  wound infection.  Goal of Therapy:  Vancomycin trough level 10-15 mcg/ml  Plan:  Vancomycin 1gm IV q12h Follow-up plans for aortogram  **Of note, [name], Mg 1.4.  Would suggest replacement especially in the setting of afib.  Toys 'R' UsKimberly Julita Davis, Pharm.D., BCPS Clinical Pharmacist Pager [phone] 09/16/2014 10:14 AM

## 2014-09-16 NOTE — Progress Notes (Signed)
Dr. Virgina OrganQureshi and Dr. Valentino Saxonothman ordered 250cc bolus due to patients BP 80s/40s.

## 2014-09-16 NOTE — Evaluation (Signed)
Clinical/Bedside Swallow Evaluation Patient Details  Name: Greg Davis MRN: [id number] Date of Birth: [birth year]  Today's Date: 09/16/2014 Time: SLP Start Time (ACUTE ONLY): 1417 SLP Stop Time (ACUTE ONLY): 1427 SLP Time Calculation (min) (ACUTE ONLY): 10 min  Past Medical History:  Past Medical History  Diagnosis Date  . Diabetes mellitus without complication   . Anemia   . Other cirrhosis of liver   . Acute respiratory failure, unspecified whether with hypoxia or hypercapnia   . Alcohol dependence with withdrawal   . Hypomagnesemia   . 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 Knee surgery    . Eye surgery    . Abdominal aortagram  09/16/2014    Procedure: Abdominal Aortagram;  Surgeon: Sherren Kernsharles E Fields, MD;  [facility];  Service: Cardiovascular;;  . Lower extremity angiogram Bilateral 09/16/2014    Procedure: Lower Extremity Angiogram;  Surgeon: Sherren Kernsharles E Fields, MD;  [facility];  Service: Cardiovascular;  Laterality: Bilateral;   HPI:  Mr. Greg Davis is a 59yo man with DM2, h/o cirrhosis and ETOH dependence who presented in Afib with RVR.  Pt had acute neurological change during vitis to clinica.  Head CT and CXR negative for acute findings.    Assessment / Plan / Recommendation Clinical Impression  Pt demonstrates normal swallow function, recommend pt initiate diet when he is able to sit fully upright, will write order. No SLP f/u needed.     Aspiration Risk  Mild    Diet Recommendation  (regular/thin)   Medication Administration: Whole meds with liquid    Other  Recommendations     Follow Up Recommendations       Frequency and Duration        Pertinent Vitals/Pain NA    SLP Swallow Goals     Swallow Study Prior Functional Status       General Other Pertinent Information: Mr. Greg Davis is a 59yo man with DM2, h/o cirrhosis and  ETOH dependence who presented in Afib with RVR.  Pt had acute neurological change during vitis to clinica.  Head CT and CXR negative for acute findings.  Type of Study: Bedside swallow evaluation Diet Prior to this Study: NPO Temperature Spikes Noted: No Respiratory Status: Room air History of Recent Intubation: No Behavior/Cognition: Alert;Cooperative Oral Cavity - Dentition: Adequate natural dentition/normal for age Self-Feeding Abilities: Able to feed self Patient Positioning: Partially reclined Baseline Vocal Quality: Normal Volitional Cough: Strong Volitional Swallow: Able to elicit    Oral/Motor/Sensory Function Overall Oral Motor/Sensory Function: Appears within functional limits for tasks assessed   Ice Chips     Thin Liquid Thin Liquid: Within functional limits    Nectar Thick Nectar Thick Liquid: Not tested   Honey Thick Honey Thick Liquid: Not tested   Puree Puree: Within functional limits   Solid   GO    Solid: Within functional limits      Harlon DittyBonnie Kailoni Vahle, MA CCC-SLP [phone]   Maryuri Warnke, Riley NearingBonnie Caroline 09/16/2014,2:50 PM

## 2014-09-16 NOTE — Progress Notes (Signed)
Paged Dr. Valentino Saxonothman for BP 81/43 witm MAP of 52. Orders for 250cc fluid bolus and drop continuous fluids to 3075ml/hr. Also updated Dr. Valentino Saxonothman of temp of 99.6.

## 2014-09-16 NOTE — Progress Notes (Signed)
Upon entering room for bedside report patient in what appearred to be SVT in the 150s.Cardizem drip on at 20 ml/hr BP 80/50 manual. Dayshift nurse paged Internal Medicine.

## 2014-09-16 NOTE — Progress Notes (Signed)
Spoke with Dr. Valentino Saxonothman for something for pain as patient is still in severe pain after receiving Norco.

## 2014-09-16 NOTE — Progress Notes (Signed)
Patient's HR still not controlled. Dr. Virgina OrganQureshi, Dr. Valentino Saxonothman at bedside along with Cardiology, orders to give Amiodarone bolus and start drip. They would like Cardizem left on at 410ml/hr at this time.

## 2014-09-16 NOTE — Progress Notes (Signed)
Dr. Virgina OrganQureshi at bedside, removed left foot dressing to assess wounds and doppler pulses. Orders given to redress foot with guaze wrap.

## 2014-09-16 NOTE — Progress Notes (Signed)
  Echocardiogram 2D Echocardiogram has been performed.  Leta JunglingCooper, Pairlee Sawtell M 09/16/2014, 3:00 PM

## 2014-09-16 NOTE — Progress Notes (Signed)
SLP Cancellation Note  Patient Details Name: Greg Davis MRN: [id number] DOB: 07-02-1955   Cancelled treatment:       Reason Eval/Treat Not Completed: Patient at procedure or test/unavailable. RN reports pt will have a procedure today, needs to remain NPO. Will /fu tomorrow for swallow eval.    Rollande Thursby, Riley NearingBonnie Caroline 09/16/2014, 11:44 AM

## 2014-09-16 NOTE — Progress Notes (Signed)
Patient complaining up nausea and severe stomach pain. Spitting up small amounts of sputum but no vomit. Paged Dr. Valentino Saxonothman for orders.

## 2014-09-17 LAB — BASIC METABOLIC PANEL
Anion gap: 8 (ref 5–15)
BUN: 5 mg/dL — AB (ref 6–20)
CALCIUM: 8.2 mg/dL — AB (ref 8.9–10.3)
CO2: 22 mmol/L (ref 22–32)
Chloride: 103 mmol/L (ref 101–111)
Creatinine, Ser: 0.68 mg/dL (ref 0.61–1.24)
GFR calc Af Amer: >60 mL/min (ref >60)
GFR calc non Af Amer: >60 mL/min (ref >60)
Glucose, Bld: 86 mg/dL (ref 65–99)
Potassium: 4 mmol/L (ref 3.5–5.1)
Sodium: 133 mmol/L — ABNORMAL LOW (ref 135–145)

## 2014-09-17 LAB — CBC
HCT: 29.4 % — ABNORMAL LOW (ref 39.0–52.0)
Hemoglobin: 9.9 g/dL — ABNORMAL LOW (ref 13.0–17.0)
MCH: 30.1 pg (ref 26.0–34.0)
MCHC: 33.7 g/dL (ref 30.0–36.0)
MCV: 89.4 fL (ref 78.0–100.0)
Platelets: 295 10*3/uL (ref 150–400)
RBC: 3.29 MIL/uL — ABNORMAL LOW (ref 4.22–5.81)
RDW: 12.6 % (ref 11.5–15.5)
WBC: 10 10*3/uL (ref 4.0–10.5)

## 2014-09-17 LAB — GLUCOSE, CAPILLARY
GLUCOSE-CAPILLARY: 115 mg/dL — AB (ref 65–99)
GLUCOSE-CAPILLARY: 117 mg/dL — AB (ref 65–99)
GLUCOSE-CAPILLARY: 90 mg/dL (ref 65–99)
Glucose-Capillary: 96 mg/dL (ref 65–99)
Glucose-Capillary: 92 mg/dL (ref 65–99)
Glucose-Capillary: 93 mg/dL (ref 65–99)
Glucose-Capillary: 86 mg/dL (ref 65–99)

## 2014-09-17 LAB — PHOSPHORUS: PHOSPHORUS: 3.5 mg/dL (ref 2.5–4.6)

## 2014-09-17 LAB — MAGNESIUM: Magnesium: 1.7 mg/dL (ref 1.7–2.4)

## 2014-09-17 LAB — HEPARIN LEVEL (UNFRACTIONATED)
HEPARIN UNFRACTIONATED: 0.58 [IU]/mL (ref 0.30–0.70)
Heparin Unfractionated: 0.16 IU/mL — ABNORMAL LOW (ref 0.30–0.70)
Heparin Unfractionated: 0.5 IU/mL (ref 0.30–0.70)

## 2014-09-17 MED ORDER — MAGNESIUM SULFATE 2 GM/50ML IV SOLN
2.0000 g | Freq: Once | INTRAVENOUS | Status: AC
  Administered 2014-09-17: 2 g via INTRAVENOUS
  Filled 2014-09-17: qty 50

## 2014-09-17 MED ORDER — AMIODARONE HCL 200 MG PO TABS
400.0000 mg | ORAL_TABLET | Freq: Two times a day (BID) | ORAL | Status: DC
  Administered 2014-09-17 – 2014-09-21 (×9): 400 mg via ORAL
  Filled 2014-09-17 (×10): qty 2

## 2014-09-17 MED ORDER — DIPHENHYDRAMINE HCL 25 MG PO CAPS
25.0000 mg | ORAL_CAPSULE | Freq: Three times a day (TID) | ORAL | Status: AC | PRN
  Administered 2014-09-17 – 2014-09-18 (×3): 25 mg via ORAL
  Filled 2014-09-17 (×3): qty 1

## 2014-09-17 MED ORDER — HYDROCODONE-ACETAMINOPHEN 5-325 MG PO TABS
1.0000 | ORAL_TABLET | ORAL | Status: DC | PRN
  Administered 2014-09-17 – 2014-09-21 (×14): 2 via ORAL
  Filled 2014-09-17 (×14): qty 2

## 2014-09-17 MED ORDER — DIPHENHYDRAMINE HCL 25 MG PO CAPS: 25.0000 mg | ORAL_CAPSULE | Freq: Three times a day (TID) | ORAL | Status: DC | PRN

## 2014-09-17 NOTE — Progress Notes (Signed)
ANTICOAGULATION CONSULT NOTE - Follow-Up Pharmacy Consult for Heparin  Indication: atrial fibrillation  No Known Allergies  Patient Measurements: Height: 5' 9.5" (176.5 cm) Weight: 147 lb (66.679 kg) IBW/kg (Calculated) : 71.85  Vital Signs: Temp: 97.7 F (36.5 C) (05/28 1127) Temp Source: Oral (05/28 1127) BP: 105/65 mmHg (05/28 1028) Pulse Rate: 83 (05/28 1028)  Labs:  Recent Labs  09/15/14 1000 09/15/14 1715  09/15/14 2157 09/16/14 0504  09/16/14 0814  09/17/14 0350 09/17/14 1115 09/17/14 1715  HGB 11.1*  --   --   --   --   --  10.0*  --  9.9*  --   --   HCT 33.0*  --   --   --   --   --  29.6*  --  29.4*  --   --   PLT 304  --   --   --   --   --  277  --  295  --   --   APTT 45*  --   --   --   --   --   --   --   --   --   --   LABPROT 15.8*  --   --   --   --   --   --   --   --   --   --   INR 1.25  --   --   --   --   --   --   --   --   --   --   HEPARINUNFRC  --   --   < >  --   --   < >  --   < > 0.16* 0.50 0.58  CREATININE 0.75  --   --   --  0.64  --   --   --  0.68  --   --   TROPONINI  --  0.04*  --  <0.03 <0.03  --   --   --   --   --   --   < > = values in this interval not displayed.  Estimated Creatinine Clearance: 95 mL/min (by C-G formula based on Cr of 0.68).  Assessment: 59 y.o. male presented to ED on 09/15/2014 with afib with RVR.  Pharmacy has been consulted to dose heparin. HL is now therapeutic at 0.58 heparin drip 1750 uts/hr. Plt wnl, H/H has trended down from BL but remains stable today with no reported significant s/s bleeding.   Of note, per cards, patient is not likely a good long term anticoagulation candidate due to ongoing EtOH use.  Goal of Therapy:  Heparin level 0.3-0.7 units/ml Monitor platelets by anticoagulation protocol: Yes   Plan:  Continue heparin at 1750 units/hr Daily heparin level and CBC   Leota SauersLisa Cid Agena Pharm.D. CPP, BCPS Clinical Pharmacist [phone] 09/17/2014 6:12 PM

## 2014-09-17 NOTE — Progress Notes (Addendum)
Subjective: Some anxiety this AM per RN. Seems to be very distressed by the idea of AKA, does not know what he wants to do. Still having significant pain in the LLE. Seems to be more indurated today.   Objective: Vital signs in last 24 hours: Filed Vitals:   09/17/14 0354 09/17/14 0445 09/17/14 0500 09/17/14 0755  BP: 112/70 96/62    Pulse: 86 61    Temp: 98.5 F (36.9 C)   97.5 F (36.4 C)  TempSrc:    Oral  Resp: 17 19    Height:      Weight:   147 lb (66.679 kg)   SpO2: 98% 96%     Weight change: -5 lb (-2.268 kg)  Intake/Output Summary (Last 24 hours) at 09/17/14 1006 Last data filed at 09/17/14 0931  Gross per 24 hour  Intake    480 ml  Output   2276 ml  Net  -1796 ml   Physical Exam: General: AA male, alert, cooperative, NAD. HEENT: Right eye w/ corneal clouding, left eye PRRL, EOMI. Moist mucus membranes. Neck: Full range of motion without pain, supple, no lymphadenopathy or carotid bruits Lungs: Clear to ascultation bilaterally, normal work of respiration, no wheezes, rales, rhonchi Heart: RRR, no murmurs, gallops, or rubs Abdomen: Soft, non-tender, non-distended, BS + Extremities: LLE w/ dressing in place. Foul smelling, indurated.  Neurologic: Alert & oriented x3, cranial nerves II-XII intact, strength grossly intact, sensation intact to light touch   Lab Results: Basic Metabolic Panel:  Recent Labs Lab 09/16/14 0504 09/17/14 0350  NA 133* 133*  [name]* 4.0  CL 103 103  CO2 19* 22  GLUCOSE 83 86  BUN 7 5*  CREATININE 0.64 0.68  CALCIUM 7.9* 8.2*  MG 1.4* 1.7  PHOS  --  3.5   Liver Function Tests:  Recent Labs Lab 09/15/14 0804  AST 22  ALT 16*  ALKPHOS 84  BILITOT 0.9  PROT 7.9  ALBUMIN 3.2*   CBC:  Recent Labs Lab 09/15/14 1000 09/16/14 0814 09/17/14 0350  WBC 12.1* 7.9 10.0  NEUTROABS 9.5*  --   --   HGB 11.1* 10.0* 9.9*  HCT 33.0* 29.6* 29.4*  MCV 90.2 89.2 89.4  PLT 304 277 295   Cardiac Enzymes:  Recent Labs Lab  09/15/14 1715 09/15/14 2157 09/16/14 0504  TROPONINI 0.04* <0.03 <0.03   CBG:  Recent Labs Lab 09/16/14 0758 09/16/14 1208 09/16/14 1636 09/16/14 2009 09/17/14 0350 09/17/14 0730  GLUCAP 87 85 76 96 92 93   Fasting Lipid Panel:  Recent Labs Lab 09/15/14 0804  CHOL 120  HDL 28*  LDLCALC 77  TRIG 75  CHOLHDL 4.3   Thyroid Function Tests:  Recent Labs Lab 09/16/14 0504  TSH 0.781   Coagulation:  Recent Labs Lab 09/15/14 1000  LABPROT 15.8*  INR 1.25   Micro Results: Recent Results (from the past 240 hour(s))  Culture, blood (routine x 2)     Status: None (Preliminary result)   Collection Time: 09/16/14 10:29 AM  Result Value Ref Range Status   Specimen Description BLOOD RIGHT HAND  Final   Special Requests BOTTLES DRAWN AEROBIC ONLY  6CC  Final   Culture   Final           BLOOD CULTURE RECEIVED NO GROWTH TO DATE CULTURE WILL BE HELD FOR 5 DAYS BEFORE ISSUING A FINAL NEGATIVE REPORT Performed at [facility]    Report Status PENDING  Incomplete  Culture, blood (routine x  2)     Status: None (Preliminary result)   Collection Time: 09/16/14 10:37 AM  Result Value Ref Range Status   Specimen Description BLOOD LEFT HAND  Final   Special Requests BOTTLES DRAWN AEROBIC ONLY  5CC  Final   Culture   Final           BLOOD CULTURE RECEIVED NO GROWTH TO DATE CULTURE WILL BE HELD FOR 5 DAYS BEFORE ISSUING A FINAL NEGATIVE REPORT [facility]    Report Status PENDING  Incomplete   Studies/Results: Ct Head Wo Contrast  09/15/2014   CLINICAL DATA:  Confusion, acute encephalopathy  EXAM: CT HEAD WITHOUT CONTRAST  TECHNIQUE: Contiguous axial images were obtained from the base of the skull through the vertex without intravenous contrast.  COMPARISON:  05/27/2013  FINDINGS: No skull fracture are noted. Stable lytic and sclerotic calvarial foci. 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 No intracranial  hemorrhage, mass effect or midline shift. Ventricular size is stable from prior exam. No acute cortical infarction. Atherosclerotic calcifications of carotid siphon again noted. No mass lesion is noted on this unenhanced scan.  IMPRESSION: No acute intracranial abnormality. Stable mild cerebral atrophy. Stable chronic findings as described   Electronically Signed   By: Natasha MeadLiviu  Pop M.D.   On: 09/15/2014 16:04   Dg Chest Port 1 View  09/15/2014   CLINICAL DATA:  Tachycardia.  EXAM: PORTABLE CHEST - 1 VIEW  COMPARISON:  November 19, 2013.  FINDINGS: The heart size and mediastinal contours are within normal limits. Both lungs are clear. No pneumothorax or pleural effusion is noted. Old left-sided rib fractures are noted. Degenerative change of left glenohumeral joint is noted.  IMPRESSION: No acute cardiopulmonary abnormality seen.   Electronically Signed   By: Lupita RaiderJames  Green Jr, M.D.   On: 09/15/2014 11:25   Dg Abd Portable 1v  09/16/2014   CLINICAL DATA:  Left upper quadrant abdominal pain.  EXAM: PORTABLE ABDOMEN - 1 VIEW  COMPARISON:  None.  FINDINGS: Paucity of bowel gas without definite evidence of obstruction.  Nondiagnostic evaluation for pneumoperitoneum secondary supine positioning exclusion a lower thorax. No definite pneumatosis or portal venous gas.  Ill-defined calcifications overlying the right upper abdominal quadrant may represent gallstones.  Vascular calcifications overlie the lower pelvis bilaterally.  No acute osseus abnormalities.  IMPRESSION: 1. Paucity of bowel gas without evidence of obstruction. 2. Ill-defined calcifications overlying the right upper quadrant may represent gallstones. Further evaluation with right upper quadrant abdominal ultrasound could be performed as clinically indicated   Electronically Signed   By: Simonne ComeJohn  Watts M.D.   On: 09/16/2014 01:45   [place] Abdomen Limited Ruq  09/16/2014   CLINICAL DATA:  Right upper quadrant pain for 3 days.  EXAM: US ABDOMEN LIMITED - RIGHT UPPER  QUADRANT  COMPARISON:  None.  FINDINGS: Gallbladder:  Numerous small gallstones fill the gallbladder. These measure 10 mm or less in size. No wall thickening. Negative sonographic Murphy's.  Common bile duct:  Diameter: Normal caliber, 4-5 mm.  Liver:  No focal lesion identified. Within normal limits in parenchymal echogenicity.  IMPRESSION: Cholelithiasis.  No sonographic evidence of acute cholecystitis.   Electronically Signed   By: Charlett NoseKevin  Dover M.D.   On: 09/16/2014 11:58   Medications: I have reviewed the patient's current medications. Scheduled Meds: . acetaminophen  650 mg Rectal Once  . aspirin  81 mg Oral Daily  . atorvastatin  80 mg Oral q1800  . insulin aspart  0-9 Units Subcutaneous 6 times per day  . metoprolol succinate  25 mg Oral Daily  . sodium chloride  3 mL Intravenous Q12H  . vancomycin  1,000 mg Intravenous Q12H   Continuous Infusions: . amiodarone 30 mg/hr (09/17/14 0438)  . heparin 1,750 Units/hr (09/17/14 0459)   PRN Meds:.HYDROcodone-acetaminophen, ondansetron (ZOFRAN) IV   Assessment/Plan: 59 y/o M w/ PMHx of DM type II, anemia, right eye blindness, h/o alcohol abuse, PVD, and chronic left foot wound, admitted for atrial fibrillation w/ RVR.   Atrial fibrillation with RVR: Patient w/ h/o PAF and previous h/o ICM w/ EF of 30-35%. Continues to be NSR this AM, rate in the 90's. Pressure stable. ECHO from yesterday shows normal EF (55-60%) and normal diastolic function. PAP 40 mmHg, some mild valve thickening. Enlarged RA. -Cardiology following, appreciate recommendations -Toprol-XL 25 mg daily.  -Continue Amiodarone gtt; change to po per cardiology recs -Continue Heparin gtt; consider discontinuation given poor candidate for long term anticoagulation  -Echo pending -Telemetry  PVD w/ Left Foot Gangrene: Arteriogram yesterday shows poor flow in LLE w/ no clear targets for intervention. VVS recommendation for AKA, however, patient is quite apprehensive about this.  Still has not decided whether he wants to do this. Made it quite clear that if he is to not have the procedure, a worsening infection in the left foot may be fatal. 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  -Vascular surgery following, appreciate recommendations. Dr. Imogene Burn to revisit on Monday -Continue ASA  daily  -Continue Vancomycin IV for now -Blood cultures x2 pending -Lipitor 80 mg qhs -Heparin as above -Vicodin prn for pain -Benadryl prn for mild anxiety  Abdominal pain: Resolved.  -Continue to monitor  Acute encephalopathy: Significantly improved from admission. Per records from outside hospital, history of alcohol related dementia. CT head without contrast with no acute intracranial abnormality. -Continue to monitor  Chronic anemia: Per records from OSH, history of anemia of chronic disease secondary to alcohol. Hgb on admission 11.1 with MCV wnl.  -continue to monitor  DM Type II: No medications for DM. -Continue to hol Lyrica 50 mg bid for now given mild change in mental status on admission -HbA1c pending  DVT/PE PPx:: Heparin gtt  Dispo: Disposition is deferred at this time, awaiting improvement of current medical problems.  Anticipated discharge in approximately 2-3 day(s).   The patient does have a current PCP (No primary care provider on file.) and does not need an [facility] hospital follow-up appointment after discharge.  The patient does not have transportation limitations that hinder transportation to clinic appointments.  .Services Needed at time of discharge: Y = Yes, Blank = No PT:   OT:   RN:   Equipment:   Other:     LOS: 2 days   Courtney Paris, MD 09/17/2014, 10:06 AM

## 2014-09-17 NOTE — Clinical Social Work Note (Signed)
Clinical Social Work Assessment  Patient Details  Name: Greg Davis MRN: [id number] Date of Birth: 1955/10/29  Date of referral:  09/17/14               Reason for consult:   ([place] )                Housing/Transportation Living arrangements for the past 2 months:  Blessing of Information:  Patient Patient Interpreter Needed:  None Criminal Activity/Legal Involvement Pertinent to Current Situation/Hospitalization:  No - Comment as needed Significant Relationships:  Siblings, Parents Lives with:  Self Do you feel safe going back to the place where you live?  No Need for family participation in patient care:  No (Coment)  Care giving concerns:  N/A  Facilities manager / plan:  CSW met the pt at the bedside. CSW introduced self and purpose of the visit.  The pt reported that living at home alone before he was admitted to [facility] and [place]. Pt reported that he does not have 24 hour care at home and would prefer to go back to SNF until he can get better. CSW answered all questions in which the pt inquired about. CSW will continue to follow this pt and assist with discharge as needed.   Employment status:  Disabled (Comment on whether or not currently receiving Disability)   Insurance information:  Medicaid [place]  Patient/Family's Response to care:  The pt reported the staff takes good care of him.   Patient/Family's Understanding of and Emotional Response to Diagnosis, Current Treatment, and Prognosis: The pt acknowledged his current medical conditions. The acknowledged that he has not been taking care of himself like he should.   Emotional Assessment Appearance:  Appears stated age Attitude/Demeanor/Rapport:   (Calm ) Affect (typically observed):  Appropriate Orientation:  Oriented to Self, Oriented to Place, Oriented to Situation Psych involvement (Current and /or in the community):  No (Comment)  Discharge Needs   Concerns to be addressed:  Denies Needs/Concerns at this time Current discharge risk:  None Barriers to Discharge:  No Barriers Identified   Ronn Smolinsky, LCSW 09/17/2014, 1:49 PM

## 2014-09-17 NOTE — Progress Notes (Signed)
Patient Name: Greg Davis      SUBJECTIVE:  59 year old male admitted 5/25 for gangrene of his left foot.  He was subsequently noted to have  atrial fibrillation with a rapid rate. He converted spontaneously to sinus rhythm. The recommendation was to transition to oral amiodarone. The plan will be to continue him on heparin and aspirin  Arteriogram, which is lost in epic, was done yesterday. Dr. Darrick Penna says he awaits Dr. Nicky Pugh input as to whether revascularization or amputation will be the next needed procedure. Echocardiogram 55-60% with normal chamber sizes   Past Medical History  Diagnosis Date  . Diabetes mellitus without complication   . Anemia   . Other cirrhosis of liver   . Acute respiratory failure, unspecified whether with hypoxia or hypercapnia   . Alcohol dependence with withdrawal   . Hypomagnesemia   . 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 acetaminophen  650 mg Rectal Once  . aspirin  81 mg Oral Daily  . atorvastatin  80 mg Oral q1800  . insulin aspart  0-9 Units Subcutaneous 6 times per day  . magnesium sulfate 1 - 4 g bolus IVPB  2 g Intravenous Once  . metoprolol succinate  25 mg Oral Daily  . sodium chloride  3 mL Intravenous Q12H  . vancomycin  1,000 mg Intravenous Q12H   Continuous Infusions: . amiodarone 30 mg/hr (09/17/14 0438)  . heparin 1,750 Units/hr (09/17/14 1052)   diphenhydrAMINE, HYDROcodone-acetaminophen, ondansetron (ZOFRAN) IV    PHYSICAL EXAM Filed Vitals:   09/17/14 0500 09/17/14 0755 09/17/14 1028 09/17/14 1127  BP:   105/65   Pulse:   83   Temp:  97.5 F (36.4 C)  97.7 F (36.5 C)  TempSrc:  Oral  Oral  Resp:      Height:      Weight: 66.679 kg (147 lb)     SpO2:       Well developed and cachechtoc  in no acute distress HENT edentuloous Neck supple  Clear Regular rate and rhythm, no murmurs or gallops Abd-soft with active BS \\no   edema Skin-warm  and dry A & Oriented  Grossly normal sensory and motor function   TELEMETRY: Reviewed telemetry pt in sinus with PACs   Intake/Output Summary (Last 24 hours) at 09/17/14 1253 Last data filed at 09/17/14 0931  Gross per 24 hour  Intake    480 ml  Output   2276 ml  Net  -1796 ml    LABS: Basic Metabolic Panel:  Recent Labs Lab 09/15/14 0812  09/15/14 1000 09/16/14 0504 09/17/14 0350  NA 138  --  137 133* 133*  K 3.8  --  3.6 3.4* 4.0  CL 101  --  103 103 103  CO2  --   --  25 19* 22  GLUCOSE 100*  --  107* 83 86  BUN 12  --  10 7 5*  CREATININE 0.70  --  0.75 0.64 0.68  CALCIUM  --   < > 8.8* 7.9* 8.2*  MG  --   < > 1.4* 1.4* 1.7  PHOS  --   --   --   --  3.5  < > = values in this interval not displayed. Cardiac Enzymes:  Recent Labs  09/15/14 1715 09/15/14 2157 09/16/14 0504  TROPONINI 0.04* <0.03 <0.03   CBC:  Recent Labs Lab 09/15/14 0812 09/15/14 1000  09/16/14 0814 09/17/14 0350  WBC  --  12.1* 7.9 10.0  NEUTROABS  --  9.5*  --   --   HGB 13.3 11.1* 10.0* 9.9*  HCT 39.0 33.0* 29.6* 29.4*  MCV  --  90.2 89.2 89.4  PLT  --  304 277 295   PROTIME:  Recent Labs  09/15/14 1000  LABPROT 15.8*  INR 1.25   Liver Function Tests:  Recent Labs  09/15/14 0804  AST 22  ALT 16*  ALKPHOS 84  BILITOT 0.9  PROT 7.9  ALBUMIN 3.2*   No results for input(s): LIPASE, AMYLASE in the last 72 hours. BNP: BNP (last 3 results) No results for input(s): BNP in the last 8760 hours.  ProBNP (last 3 results) No results for input(s): PROBNP in the last 8760 hours.  D-Dimer: No results for input(s): DDIMER in the last 72 hours. Hemoglobin A1C: No results for input(s): HGBA1C in the last 72 hours. Fasting Lipid Panel:  Recent Labs  09/15/14 0804  CHOL 120  HDL 28*  LDLCALC 77  TRIG 75  CHOLHDL 4.3   Thyroid Function Tests:  Recent Labs  09/16/14 0504  TSH 0.781   **   ASSESSMENT AND PLAN:  Active Problems:   Atrial fibrillation with  RVR   Acute encephalopathy   Left foot infection   PVD (peripheral vascular disease)   RUQ pain  He has converted to sinus rhythm We'll change his amiodarone from IV--by mouth.  We'll follow with you.   Signed, Sherryl MangesSteven Klein MD  09/17/2014

## 2014-09-17 NOTE — Progress Notes (Signed)
ANTICOAGULATION CONSULT NOTE - Follow-Up Pharmacy Consult for Heparin  Indication: atrial fibrillation  No Known Allergies  Patient Measurements: Height: 5' 9.5" (176.5 cm) Weight: 146 lb 3.2 oz (66.316 kg) IBW/kg (Calculated) : 71.85  Vital Signs: Temp: 98.5 F (36.9 C) (05/28 0354) Temp Source: Axillary (05/27 2015) BP: 112/70 mmHg (05/28 0354) Pulse Rate: 86 (05/28 0354)  Labs:  Recent Labs  09/15/14 [id number] 09/15/14 1000 09/15/14 1715  09/15/14 2157 09/16/14 0504 09/16/14 0505 09/16/14 0814 09/16/14 1656 09/17/14 0350  HGB 13.3 11.1*  --   --   --   --   --  10.0*  --  9.9*  HCT 39.0 33.0*  --   --   --   --   --  29.6*  --  29.4*  PLT  --  304  --   --   --   --   --  277  --  295  APTT  --  45*  --   --   --   --   --   --   --   --   LABPROT  --  15.8*  --   --   --   --   --   --   --   --   INR  --  1.25  --   --   --   --   --   --   --   --   HEPARINUNFRC  --   --   --   < >  --   --  0.10*  --  <0.10* 0.16*  CREATININE 0.70 0.75  --   --   --  0.64  --   --   --   --   TROPONINI  --   --  0.04*  --  <0.03 <0.03  --   --   --   --   < > = values in this interval not displayed.  Estimated Creatinine Clearance: 94.4 mL/min (by C-G formula based on Cr of 0.64).  Assessment: 59 y.o. male presented to ED with afib with RVR.  Pt was started on heparin 5/26 but had not yet been therapeutic.  Heparin was stopped today for aortogram.  Paged Dr. Imogene Burnhen and received orders to restart 6 hours after sheath pull (done at 1330 today).  Will restart heparin at 1930 tonight.  Of note, per cards, patient is not likely a good long term anticoagulation candidate due to ongoing EtOH use.  HL is 0.16 on heparin 1500 units/hr. Nurse reports no issues with infusion or bleeding.  Goal of Therapy:  Heparin level 0.3-0.7 units/ml Monitor platelets by anticoagulation protocol: Yes   Plan:  Increase heparin to 1750 units/hr 6h HL Daily heparin level and CBC  Arlean Hoppingorey M. Newman PiesBall,  PharmD Clinical Pharmacist Pager [phone]  09/17/2014 4:51 AM

## 2014-09-17 NOTE — Progress Notes (Signed)
ANTICOAGULATION CONSULT NOTE - Follow-Up Pharmacy Consult for Heparin  Indication: atrial fibrillation  No Known Allergies  Patient Measurements: Height: 5' 9.5" (176.5 cm) Weight: 147 lb (66.679 kg) IBW/kg (Calculated) : 71.85  Vital Signs: Temp: 97.7 F (36.5 C) (05/28 1127) Temp Source: Oral (05/28 1127) BP: 105/65 mmHg (05/28 1028) Pulse Rate: 83 (05/28 1028)  Labs:  Recent Labs  09/15/14 1000 09/15/14 1715  09/15/14 2157 09/16/14 0504  09/16/14 0814 09/16/14 1656 09/17/14 0350 09/17/14 1115  HGB 11.1*  --   --   --   --   --  10.0*  --  9.9*  --   HCT 33.0*  --   --   --   --   --  29.6*  --  29.4*  --   PLT 304  --   --   --   --   --  277  --  295  --   APTT 45*  --   --   --   --   --   --   --   --   --   LABPROT 15.8*  --   --   --   --   --   --   --   --   --   INR 1.25  --   --   --   --   --   --   --   --   --   HEPARINUNFRC  --   --   < >  --   --   < >  --  <0.10* 0.16* 0.50  CREATININE 0.75  --   --   --  0.64  --   --   --  0.68  --   TROPONINI  --  0.04*  --  <0.03 <0.03  --   --   --   --   --   < > = values in this interval not displayed.  Estimated Creatinine Clearance: 95 mL/min (by C-G formula based on Cr of 0.68).  Assessment: 59 y.o. male presented to ED on 09/15/2014 with afib with RVR.  Pharmacy has been consulted to dose heparin. HL is now therapeutic at 0.50 after multiple rate increases. Plt wnl, H/H has trended down from BL but remains stable today with no reported significant s/s bleeding.   Of note, per cards, patient is not likely a good long term anticoagulation candidate due to ongoing EtOH use.  Goal of Therapy:  Heparin level 0.3-0.7 units/ml Monitor platelets by anticoagulation protocol: Yes   Plan:  Continue heparin at 1750 units/hr 6h HL to confirm Daily heparin level and CBC  Cosette Prindle K. Bonnye FavaNicolsen, PharmD, BCPS Clinical Pharmacist - Resident Pager: [phone] Pharmacy: [phone] 09/17/2014 12:32 PM

## 2014-09-18 LAB — SURGICAL PCR SCREEN
MRSA, PCR: POSITIVE — AB
Staphylococcus aureus: POSITIVE — AB

## 2014-09-18 LAB — GLUCOSE, CAPILLARY
GLUCOSE-CAPILLARY: 91 mg/dL (ref 65–99)
GLUCOSE-CAPILLARY: 76 mg/dL (ref 65–99)
GLUCOSE-CAPILLARY: 120 mg/dL — AB (ref 65–99)
Glucose-Capillary: 107 mg/dL — ABNORMAL HIGH (ref 65–99)

## 2014-09-18 LAB — CBC
HCT: 29.7 % — ABNORMAL LOW (ref 39.0–52.0)
HEMOGLOBIN: 10 g/dL — AB (ref 13.0–17.0)
MCH: 30.3 pg (ref 26.0–34.0)
MCHC: 33.7 g/dL (ref 30.0–36.0)
MCV: 90 fL (ref 78.0–100.0)
Platelets: 319 10*3/uL (ref 150–400)
RBC: 3.3 MIL/uL — AB (ref 4.22–5.81)
RDW: 12.8 % (ref 11.5–15.5)
WBC: 10 10*3/uL (ref 4.0–10.5)

## 2014-09-18 LAB — HEPARIN LEVEL (UNFRACTIONATED): Heparin Unfractionated: 0.49 IU/mL (ref 0.30–0.70)

## 2014-09-18 MED ORDER — OXYCODONE HCL 5 MG PO TABS
5.0000 mg | ORAL_TABLET | Freq: Once | ORAL | Status: AC
  Administered 2014-09-18: 5 mg via ORAL
  Filled 2014-09-18: qty 1

## 2014-09-18 MED ORDER — CHLORHEXIDINE GLUCONATE CLOTH 2 % EX PADS
6.0000 | MEDICATED_PAD | Freq: Every day | CUTANEOUS | Status: DC
  Administered 2014-09-19 – 2014-09-21 (×3): 6 via TOPICAL

## 2014-09-18 MED ORDER — MUPIROCIN 2 % EX OINT
1.0000 "application " | TOPICAL_OINTMENT | Freq: Two times a day (BID) | CUTANEOUS | Status: DC
  Administered 2014-09-19 – 2014-09-21 (×5): 1 via NASAL
  Filled 2014-09-18 (×2): qty 22

## 2014-09-18 NOTE — Progress Notes (Signed)
   Daily Progress Note  Assessment/Planning: POD #2 s/p L 3rd and 4th toe wet gangrene   Pt amend to proceeding with L AKA at this point  Pt already ate today so will need to schedule for tomorrow  Pt is stable without evidence of sepsis so I don't think a guillotine BKA is needed at this point.   Subjective  - 2 Days Post-Op  Ok to L AKA at this point  Objective Filed Vitals:   09/18/14 0343 09/18/14 0640 09/18/14 0819 09/18/14 1026  BP: 116/59   120/70  Pulse: 82   70  Temp: 97.5 F (36.4 C)  98.4 F (36.9 C)   TempSrc:   Axillary   Resp: 16     Height:      Weight:  147 lb 9.6 oz (66.951 kg)    SpO2: 100%       Intake/Output Summary (Last 24 hours) at 09/18/14 1415 Last data filed at 09/18/14 1208  Gross per 24 hour  Intake 1439.19 ml  Output   1925 ml  Net -485.81 ml   VASC  L 3rd and 4th toe cleaner than previously, but some seropurulent drainage still evident  Laboratory CBC    Component Value Date/Time   WBC 10.0 09/18/2014 0340   HGB 10.0* 09/18/2014 0340   HCT 29.7* 09/18/2014 0340   PLT 319 09/18/2014 0340    BMET    Component Value Date/Time   NA 133* 09/17/2014 0350   K 4.0 09/17/2014 0350   CL 103 09/17/2014 0350   CO2 22 09/17/2014 0350   GLUCOSE 86 09/17/2014 0350   BUN 5* 09/17/2014 0350   CREATININE 0.68 09/17/2014 0350   CALCIUM 8.2* 09/17/2014 0350   GFRNONAA >60 09/17/2014 0350   GFRAA >60 09/17/2014 0350    Leonides SakeBrian Chen, MD [facility] Office: [phone] Pager: [phone]  09/18/2014, 2:15 PM

## 2014-09-18 NOTE — Progress Notes (Signed)
ANTICOAGULATION CONSULT NOTE - Follow-Up Pharmacy Consult for Heparin  Indication: atrial fibrillation  No Known Allergies  Patient Measurements: Height: 5' 9.5" (176.5 cm) Weight: 147 lb 9.6 oz (66.951 kg) IBW/kg (Calculated) : 71.85  Vital Signs: Temp: 98.4 F (36.9 C) (05/29 0819) Temp Source: Axillary (05/29 0819) BP: 120/70 mmHg (05/29 1026) Pulse Rate: 70 (05/29 1026)  Labs:  Recent Labs  09/15/14 1715  09/15/14 2157 09/16/14 0504  09/16/14 0814  09/17/14 0350 09/17/14 1115 09/17/14 1715 09/18/14 0340  HGB  --   --   --   --   < > 10.0*  --  9.9*  --   --  10.0*  HCT  --   --   --   --   --  29.6*  --  29.4*  --   --  29.7*  PLT  --   --   --   --   --  277  --  295  --   --  319  HEPARINUNFRC  --   < >  --   --   < >  --   < > 0.16* 0.50 0.58 0.49  CREATININE  --   --   --  0.64  --   --   --  0.68  --   --   --   TROPONINI 0.04*  --  <0.03 <0.03  --   --   --   --   --   --   --   < > = values in this interval not displayed.  Estimated Creatinine Clearance: 95.4 mL/min (by C-G formula based on Cr of 0.68).  Assessment: 59 y.o. male presented to ED on 09/15/2014 with afib with RVR. Pharmacy has been consulted to dose heparin. HL remains therapeutic at 0.49. Plt wnl, H/H has trended down from BL but remains stable today with no reported significant s/s bleeding.   Of note, per cards, patient is not likely a good long term anticoagulation candidate due to ongoing EtOH use.  Goal of Therapy:  Heparin level 0.3-0.7 units/ml Monitor platelets by anticoagulation protocol: Yes   Plan:  Continue heparin at 1750 units/hr Daily heparin level and CBC F/u plans to continue anticoag  Korbin Notaro K. Bonnye FavaNicolsen, PharmD, BCPS Clinical Pharmacist - Resident Pager: [phone] Pharmacy: [phone] 09/18/2014 10:52 AM

## 2014-09-18 NOTE — Progress Notes (Signed)
Subjective: Looks better today. Less anxious, states he wishes to have the AKA for his left foot infection.   Objective: Vital signs in last 24 hours: Filed Vitals:   09/17/14 2245 09/18/14 0343 09/18/14 0640 09/18/14 0819  BP: 112/70 116/59    Pulse: 80 82    Temp: 98 F (36.7 C) 97.5 F (36.4 C)  98.4 F (36.9 C)  TempSrc:    Axillary  Resp: 18 16    Height:      Weight:   147 lb 9.6 oz (66.951 kg)   SpO2: 99% 100%     Weight change: 9.6 oz (0.272 kg)  Intake/Output Summary (Last 24 hours) at 09/18/14 0914 Last data filed at 09/18/14 1610  Gross per 24 hour  Intake 1439.19 ml  Output   2000 ml  Net -560.81 ml   Physical Exam: General: AA male, alert, cooperative, NAD. HEENT: Right eye w/ corneal clouding, left eye PRRL, EOMI. Moist mucus membranes. Neck: Full range of motion without pain, supple, no lymphadenopathy or carotid bruits Lungs: Clear to ascultation bilaterally, normal work of respiration, no wheezes, rales, rhonchi Heart: RRR, no murmurs, gallops, or rubs Abdomen: Soft, non-tender, non-distended, BS + Extremities: LLE w/ dressing in place. Foul smelling, indurated.  Neurologic: Alert & oriented x3, cranial nerves II-XII intact, strength grossly intact, sensation intact to light touch   Lab Results: Basic Metabolic Panel:  Recent Labs Lab 09/16/14 0504 09/17/14 0350  NA 133* 133*  [name]* 4.0  CL 103 103  CO2 19* 22  GLUCOSE 83 86  BUN 7 5*  CREATININE 0.64 0.68  CALCIUM 7.9* 8.2*  MG 1.4* 1.7  PHOS  --  3.5   Liver Function Tests:  Recent Labs Lab 09/15/14 0804  AST 22  ALT 16*  ALKPHOS 84  BILITOT 0.9  PROT 7.9  ALBUMIN 3.2*   CBC:  Recent Labs Lab 09/15/14 1000  09/17/14 0350 09/18/14 0340  WBC 12.1*  < > 10.0 10.0  NEUTROABS 9.5*  --   --   --   HGB 11.1*  < > 9.9* 10.0*  HCT 33.0*  < > 29.4* 29.7*  MCV 90.2  < > 89.4 90.0  PLT 304  < > 295 319  < > = values in this interval not displayed. Cardiac  Enzymes:  Recent Labs Lab 09/15/14 1715 09/15/14 2157 09/16/14 0504  TROPONINI 0.04* <0.03 <0.03   CBG:  Recent Labs Lab 09/17/14 1121 09/17/14 1626 09/17/14 2006 09/17/14 2346 09/18/14 0347 09/18/14 0735  GLUCAP 117* 115* 86 90 91 76   Fasting Lipid Panel:  Recent Labs Lab 09/15/14 0804  CHOL 120  HDL 28*  LDLCALC 77  TRIG 75  CHOLHDL 4.3   Thyroid Function Tests:  Recent Labs Lab 09/16/14 0504  TSH 0.781   Coagulation:  Recent Labs Lab 09/15/14 1000  LABPROT 15.8*  INR 1.25   Micro Results: Recent Results (from the past 240 hour(s))  Culture, blood (routine x 2)     Status: None (Preliminary result)   Collection Time: 09/16/14 10:29 AM  Result Value Ref Range Status   Specimen Description BLOOD RIGHT HAND  Final   Special Requests BOTTLES DRAWN AEROBIC ONLY  6CC  Final   Culture   Final           BLOOD CULTURE RECEIVED NO GROWTH TO DATE CULTURE WILL BE HELD FOR 5 DAYS BEFORE ISSUING A FINAL NEGATIVE REPORT Performed at [facility]    Report Status  PENDING  Incomplete  Culture, blood (routine x 2)     Status: None (Preliminary result)   Collection Time: 09/16/14 10:37 AM  Result Value Ref Range Status   Specimen Description BLOOD LEFT HAND  Final   Special Requests BOTTLES DRAWN AEROBIC ONLY  5CC  Final   Culture   Final           BLOOD CULTURE RECEIVED NO GROWTH TO DATE CULTURE WILL BE HELD FOR 5 DAYS BEFORE ISSUING A FINAL NEGATIVE REPORT Performed at [facility]    Report Status PENDING  Incomplete   Studies/Results: [place] Abdomen Limited Ruq  09/16/2014   CLINICAL DATA:  Right upper quadrant pain for 3 days.  EXAM: US ABDOMEN LIMITED - RIGHT UPPER QUADRANT  COMPARISON:  None.  FINDINGS: Gallbladder:  Numerous small gallstones fill the gallbladder. These measure 10 mm or less in size. No wall thickening. Negative sonographic Murphy's.  Common bile duct:  Diameter: Normal caliber, 4-5 mm.  Liver:  No focal lesion identified.  Within normal limits in parenchymal echogenicity.  IMPRESSION: Cholelithiasis.  No sonographic evidence of acute cholecystitis.   Electronically Signed   By: Charlett NoseKevin  Dover M.D.   On: 09/16/2014 11:58   Medications: I have reviewed the patient's current medications. Scheduled Meds: . acetaminophen  650 mg Rectal Once  . amiodarone  400 mg Oral BID  . aspirin  81 mg Oral Daily  . atorvastatin  80 mg Oral q1800  . insulin aspart  0-9 Units Subcutaneous 6 times per day  . metoprolol succinate  25 mg Oral Daily  . sodium chloride  3 mL Intravenous Q12H  . vancomycin  1,000 mg Intravenous Q12H   Continuous Infusions: . heparin 1,750 Units/hr (09/17/14 2336)   PRN Meds:.diphenhydrAMINE, HYDROcodone-acetaminophen, ondansetron (ZOFRAN) IV   Assessment/Plan: 59 y/o M w/ PMHx of DM type II, anemia, right eye blindness, h/o alcohol abuse, PVD, and chronic left foot wound, admitted for atrial fibrillation w/ RVR.   Atrial fibrillation with RVR: Continues to be in NSR. No symptoms.  -Cardiology following, appreciate recommendations -Toprol-XL 25 mg daily.  -Amiodarone 400 mg bid -Continue Heparin gtt for now, likely AKA early this week.  -Telemetry  PVD w/ Left Foot Gangrene: VVS recommendations for left AKA, however patient quite apprehensive. As of today, patient has agreed to proceed w/ surgery. Blood cultures w/ no growth to date.  -Continue ASA 81mg  daily  -Continue Vancomycin IV for now -Lipitor 80 mg qhs -Heparin as above -Vicodin prn for pain -Benadryl prn for mild anxiety  Acute encephalopathy: Seems to be at his baseline.  -Continue to monitor  Chronic anemia: Stable.  -CBC in AM  DM Type II: No medications for DM. -ISS -HbA1c pending  DVT/PE PPx:: Heparin gtt  Dispo: Disposition is deferred at this time, awaiting improvement of current medical problems.  Anticipated discharge in approximately 2-3 day(s).   The patient does have a current PCP (No primary care provider on  file.) and [facility] after [facility].  The patient does not have transportation limitations that hinder transportation to clinic appointments.  .Services Needed at time of discharge: Y = Yes, Blank = No PT:   OT:   RN:   Equipment:   Other:     LOS: 3 days   Courtney ParisEden W Aletta Edmunds, MD 09/18/2014, 9:14 AM

## 2014-09-18 NOTE — Progress Notes (Signed)
Patient Name: Greg Davis      SUBJECTIVE:  59 year old male admitted 5/25 for gangrene of his left foot.  He was subsequently noted to have  atrial fibrillation with a rapid rate. He converted spontaneously to sinus rhythm. The recommendation was to transition to oral amiodarone. The plan will be to continue him on heparin and aspirin  Arteriogram, which is lost in epic, was done yesterday. Dr. Darrick Penna says he awaits Dr. Nicky Pugh input as to whether revascularization or amputation will be the next needed procedure. Echocardiogram 55-60% with normal chamber sizes  The patient denies chest pain, shortness of breath, nocturnal dyspnea, orthopnea or peripheral edema.    Past Medical History  Diagnosis Date  . Diabetes mellitus without complication   . Anemia   . Other cirrhosis of liver   . Acute respiratory failure, unspecified whether with hypoxia or hypercapnia   . Alcohol dependence with withdrawal   . Hypomagnesemia   . 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 acetaminophen  650 mg Rectal Once  . amiodarone  400 mg Oral BID  . aspirin  81 mg Oral Daily  . atorvastatin  80 mg Oral q1800  . insulin aspart  0-9 Units Subcutaneous 6 times per day  . metoprolol succinate  25 mg Oral Daily  . sodium chloride  3 mL Intravenous Q12H  . vancomycin  1,000 mg Intravenous Q12H   Continuous Infusions: . heparin 1,750 Units/hr (09/17/14 2336)   diphenhydrAMINE, HYDROcodone-acetaminophen, ondansetron (ZOFRAN) IV    PHYSICAL EXAM Filed Vitals:   09/17/14 2245 09/18/14 0343 09/18/14 0640 09/18/14 0819  BP: 112/70 116/59    Pulse: 80 82    Temp: 98 F (36.7 C) 97.5 F (36.4 C)  98.4 F (36.9 C)  TempSrc:    Axillary  Resp: 18 16    Height:      Weight:   66.951 kg (147 lb 9.6 oz)   SpO2: 99% 100%     Well developed and cachechtic  in no acute distress HENT edentuloous Neck supple  Clear Regular rate with  irregularity and rhythm,  Abd-soft with active BS \\no   edema Skin-warm and dry A & Oriented  Grossly normal sensory and motor function   TELEMETRY: Reviewed telemetry pt in sinus with PACs   Intake/Output Summary (Last 24 hours) at 09/18/14 0851 Last data filed at 09/18/14 0639  Gross per 24 hour  Intake 1679.19 ml  Output   2000 ml  Net -320.81 ml    LABS: Basic Metabolic Panel:  Recent Labs Lab 09/15/14 0812  09/15/14 1000 09/16/14 0504 09/17/14 0350  NA 138  --  137 133* 133*  K 3.8  --  3.6 3.4* 4.0  CL 101  --  103 103 103  CO2  --   --  25 19* 22  GLUCOSE 100*  --  107* 83 86  BUN 12  --  10 7 5*  CREATININE 0.70  --  0.75 0.64 0.68  CALCIUM  --   < > 8.8* 7.9* 8.2*  MG  --   < > 1.4* 1.4* 1.7  PHOS  --   --   --   --  3.5  < > = values in this interval not displayed. Cardiac Enzymes:  Recent Labs  09/15/14 1715 09/15/14 2157 09/16/14 0504  TROPONINI 0.04* <0.03 <0.03   CBC:  Recent Labs Lab 09/15/14 [phone]  09/15/14 1000 09/16/14 0814 09/17/14 0350 09/18/14 0340  WBC  --  12.1* 7.9 10.0 10.0  NEUTROABS  --  9.5*  --   --   --   HGB 13.3 11.1* 10.0* 9.9* 10.0*  HCT 39.0 33.0* 29.6* 29.4* 29.7*  MCV  --  90.2 89.2 89.4 90.0  PLT  --  304 277 295 319   PROTIME:  Recent Labs  09/15/14 1000  LABPROT 15.8*  INR 1.25   Liver Function Tests: No results for input(s): AST, ALT, ALKPHOS, BILITOT, PROT, ALBUMIN in the last 72 hours. No results for input(s): LIPASE, AMYLASE in the last 72 hours. BNP: BNP (last 3 results) No results for input(s): BNP in the last 8760 hours.  ProBNP (last 3 results) No results for input(s): PROBNP in the last 8760 hours.  D-Dimer: No results for input(s): DDIMER in the last 72 hours. Hemoglobin A1C: No results for input(s): HGBA1C in the last 72 hours. Fasting Lipid Panel: No results for input(s): CHOL, HDL, LDLCALC, TRIG, CHOLHDL, LDLDIRECT in the last 72 hours. Thyroid Function Tests:  Recent Labs   09/16/14 0504  TSH 0.781   **   ASSESSMENT AND PLAN:  Active Problems:   Atrial fibrillation with RVR   Acute encephalopathy   Left foot infection   PVD (peripheral vascular disease)   RUQ pain  Continue amio and heparin and ASA   Await plan from [facility]   Signed, Sherryl MangesSteven Sparrow [place] MD  09/18/2014

## 2014-09-19 ENCOUNTER — Encounter (HOSPITAL_COMMUNITY)
Admission: EM | Disposition: A | Discharge status: Skilled Nursing Facility | Payer: Self-pay | Source: Home / Self Care | Attending: Internal Medicine

## 2014-09-19 ENCOUNTER — Inpatient Hospital Stay (HOSPITAL_COMMUNITY): Payer: Medicaid Other | Admitting: Certified Registered"

## 2014-09-19 ENCOUNTER — Encounter (HOSPITAL_COMMUNITY): Payer: Self-pay | Admitting: Anesthesiology

## 2014-09-19 DIAGNOSIS — I70262 Atherosclerosis of native arteries of extremities with gangrene, left leg: Secondary | ICD-10-CM

## 2014-09-19 DIAGNOSIS — I480 Paroxysmal atrial fibrillation: Principal | ICD-10-CM

## 2014-09-19 HISTORY — PX: AMPUTATION: SHX166

## 2014-09-19 LAB — GLUCOSE, CAPILLARY
GLUCOSE-CAPILLARY: 133 mg/dL — AB (ref 65–99)
Glucose-Capillary: 102 mg/dL — ABNORMAL HIGH (ref 65–99)
Glucose-Capillary: 124 mg/dL — ABNORMAL HIGH (ref 65–99)
Glucose-Capillary: 132 mg/dL — ABNORMAL HIGH (ref 65–99)
Glucose-Capillary: 68 mg/dL (ref 65–99)
Glucose-Capillary: 76 mg/dL (ref 65–99)
Glucose-Capillary: 78 mg/dL (ref 65–99)

## 2014-09-19 LAB — CBC
HEMATOCRIT: 30.7 % — AB (ref 39.0–52.0)
Hemoglobin: 10.2 g/dL — ABNORMAL LOW (ref 13.0–17.0)
MCH: 29.9 pg (ref 26.0–34.0)
MCHC: 33.2 g/dL (ref 30.0–36.0)
MCV: 90 fL (ref 78.0–100.0)
Platelets: 329 10*3/uL (ref 150–400)
RBC: 3.41 MIL/uL — ABNORMAL LOW (ref 4.22–5.81)
RDW: 12.8 % (ref 11.5–15.5)
WBC: 11.5 10*3/uL — ABNORMAL HIGH (ref 4.0–10.5)

## 2014-09-19 LAB — BASIC METABOLIC PANEL
Anion gap: 9 (ref 5–15)
BUN: 10 mg/dL (ref 6–20)
CALCIUM: 8.9 mg/dL (ref 8.9–10.3)
CHLORIDE: 101 mmol/L (ref 101–111)
CO2: 25 mmol/L (ref 22–32)
CREATININE: 0.82 mg/dL (ref 0.61–1.24)
GFR calc Af Amer: >60 mL/min (ref >60)
GFR calc non Af Amer: >60 mL/min (ref >60)
Glucose, Bld: 85 mg/dL (ref 65–99)
Potassium: 4.4 mmol/L (ref 3.5–5.1)
Sodium: 135 mmol/L (ref 135–145)

## 2014-09-19 LAB — HEPARIN LEVEL (UNFRACTIONATED): HEPARIN UNFRACTIONATED: 0.5 [IU]/mL (ref 0.30–0.70)

## 2014-09-19 SURGERY — AMPUTATION, ABOVE KNEE
Anesthesia: General | Laterality: Left

## 2014-09-19 MED ORDER — DEXAMETHASONE SODIUM PHOSPHATE 4 MG/ML IJ SOLN
INTRAMUSCULAR | Status: DC | PRN
  Administered 2014-09-19: 8 mg via INTRAVENOUS

## 2014-09-19 MED ORDER — GUAIFENESIN-DM 100-10 MG/5ML PO SYRP
15.0000 mL | ORAL_SOLUTION | ORAL | Status: DC | PRN
Start: 2014-09-19 — End: 2014-09-21

## 2014-09-19 MED ORDER — MORPHINE SULFATE 2 MG/ML IJ SOLN
2.0000 mg | INTRAMUSCULAR | Status: DC | PRN
  Administered 2014-09-19: 2 mg via INTRAVENOUS
  Filled 2014-09-19: qty 1

## 2014-09-19 MED ORDER — PROPOFOL 10 MG/ML IV BOLUS
INTRAVENOUS | Status: DC | PRN
  Administered 2014-09-19: 50 mg via INTRAVENOUS
  Administered 2014-09-19: 200 mg via INTRAVENOUS

## 2014-09-19 MED ORDER — OXYCODONE HCL 5 MG PO TABS
5.0000 mg | ORAL_TABLET | Freq: Once | ORAL | Status: AC
  Administered 2014-09-19: 5 mg via ORAL
  Filled 2014-09-19: qty 1

## 2014-09-19 MED ORDER — SODIUM CHLORIDE 0.9 % IV SOLN
INTRAVENOUS | Status: DC
  Administered 2014-09-19: 18:00:00 via INTRAVENOUS

## 2014-09-19 MED ORDER — LACTATED RINGERS IV SOLN
INTRAVENOUS | Status: DC | PRN
  Administered 2014-09-19: 14:00:00 via INTRAVENOUS

## 2014-09-19 MED ORDER — METOPROLOL TARTRATE 1 MG/ML IV SOLN
5.0000 mg | Freq: Once | INTRAVENOUS | Status: AC
  Administered 2014-09-19: 5 mg via INTRAVENOUS
  Filled 2014-09-19: qty 5

## 2014-09-19 MED ORDER — ESMOLOL HCL 10 MG/ML IV SOLN
INTRAVENOUS | Status: AC
  Filled 2014-09-19: qty 20

## 2014-09-19 MED ORDER — ACETAMINOPHEN 325 MG RE SUPP: 325.0000 mg | RECTAL | Status: DC | PRN

## 2014-09-19 MED ORDER — 0.9 % SODIUM CHLORIDE (POUR BTL) OPTIME
TOPICAL | Status: DC | PRN
  Administered 2014-09-19: 1000 mL

## 2014-09-19 MED ORDER — MIDAZOLAM HCL 2 MG/2ML IJ SOLN
INTRAMUSCULAR | Status: AC
  Filled 2014-09-19: qty 2

## 2014-09-19 MED ORDER — OXYCODONE HCL 5 MG/5ML PO SOLN: 5.0000 mg | Freq: Once | ORAL | Status: AC | PRN

## 2014-09-19 MED ORDER — ONDANSETRON HCL 4 MG/2ML IJ SOLN
INTRAMUSCULAR | Status: DC | PRN
  Administered 2014-09-19: 4 mg via INTRAVENOUS

## 2014-09-19 MED ORDER — METOPROLOL TARTRATE 1 MG/ML IV SOLN
2.0000 mg | INTRAVENOUS | Status: DC | PRN
Start: 2014-09-19 — End: 2014-09-21

## 2014-09-19 MED ORDER — FENTANYL CITRATE (PF) 100 MCG/2ML IJ SOLN
INTRAMUSCULAR | Status: DC | PRN
  Administered 2014-09-19: 50 ug via INTRAVENOUS
  Administered 2014-09-19: 100 ug via INTRAVENOUS

## 2014-09-19 MED ORDER — OXYCODONE HCL 5 MG PO TABS
5.0000 mg | ORAL_TABLET | Freq: Once | ORAL | Status: AC | PRN
  Administered 2014-09-19: 5 mg via ORAL

## 2014-09-19 MED ORDER — ALUM & MAG HYDROXIDE-SIMETH 200-200-20 MG/5ML PO SUSP: 15.0000 mL | ORAL | Status: DC | PRN

## 2014-09-19 MED ORDER — PHENYLEPHRINE HCL 10 MG/ML IJ SOLN
INTRAMUSCULAR | Status: DC | PRN
  Administered 2014-09-19: 80 ug via INTRAVENOUS
  Administered 2014-09-19: 120 ug via INTRAVENOUS
  Administered 2014-09-19 (×3): 80 ug via INTRAVENOUS
  Administered 2014-09-19: 120 ug via INTRAVENOUS
  Administered 2014-09-19: 80 ug via INTRAVENOUS

## 2014-09-19 MED ORDER — ACETAMINOPHEN 325 MG PO TABS: 325.0000 mg | ORAL_TABLET | ORAL | Status: DC | PRN

## 2014-09-19 MED ORDER — CEFAZOLIN SODIUM-DEXTROSE 2-3 GM-% IV SOLR
INTRAVENOUS | Status: AC
  Filled 2014-09-19: qty 100

## 2014-09-19 MED ORDER — OXYCODONE HCL 5 MG PO TABS
ORAL_TABLET | ORAL | Status: AC
  Filled 2014-09-19: qty 1

## 2014-09-19 MED ORDER — HYDROMORPHONE HCL 1 MG/ML IJ SOLN
INTRAMUSCULAR | Status: AC
  Administered 2014-09-19: 0.5 mg via INTRAVENOUS
  Filled 2014-09-19: qty 1

## 2014-09-19 MED ORDER — DOCUSATE SODIUM 100 MG PO CAPS
100.0000 mg | ORAL_CAPSULE | Freq: Every day | ORAL | Status: DC
  Administered 2014-09-20 – 2014-09-21 (×2): 100 mg via ORAL
  Filled 2014-09-19 (×2): qty 1

## 2014-09-19 MED ORDER — FENTANYL CITRATE (PF) 250 MCG/5ML IJ SOLN
INTRAMUSCULAR | Status: AC
  Filled 2014-09-19: qty 5

## 2014-09-19 MED ORDER — PROPOFOL 10 MG/ML IV BOLUS
INTRAVENOUS | Status: AC
  Filled 2014-09-19: qty 20

## 2014-09-19 MED ORDER — LIDOCAINE HCL (CARDIAC) 20 MG/ML IV SOLN
INTRAVENOUS | Status: DC | PRN
  Administered 2014-09-19: 60 mg via INTRAVENOUS

## 2014-09-19 MED ORDER — ONDANSETRON HCL 4 MG/2ML IJ SOLN: 4.0000 mg | Freq: Four times a day (QID) | INTRAMUSCULAR | Status: DC | PRN

## 2014-09-19 MED ORDER — MIDAZOLAM HCL 5 MG/5ML IJ SOLN
INTRAMUSCULAR | Status: DC | PRN
  Administered 2014-09-19: 1 mg via INTRAVENOUS

## 2014-09-19 MED ORDER — HYDRALAZINE HCL 20 MG/ML IJ SOLN: 5.0000 mg | INTRAMUSCULAR | Status: DC | PRN

## 2014-09-19 MED ORDER — HYDROMORPHONE HCL 1 MG/ML IJ SOLN
0.2500 mg | INTRAMUSCULAR | Status: DC | PRN
  Administered 2014-09-19 (×2): 0.5 mg via INTRAVENOUS

## 2014-09-19 MED ORDER — NEOSTIGMINE METHYLSULFATE 10 MG/10ML IV SOLN
INTRAVENOUS | Status: AC
  Filled 2014-09-19: qty 1

## 2014-09-19 MED ORDER — HEPARIN (PORCINE) IN NACL 100-0.45 UNIT/ML-% IJ SOLN
1750.0000 [IU]/h | INTRAMUSCULAR | Status: DC
  Administered 2014-09-19: 1750 [IU]/h via INTRAVENOUS
  Filled 2014-09-19: qty 250

## 2014-09-19 MED ORDER — PANTOPRAZOLE SODIUM 40 MG PO TBEC
40.0000 mg | DELAYED_RELEASE_TABLET | Freq: Every day | ORAL | Status: DC
  Administered 2014-09-19 – 2014-09-21 (×3): 40 mg via ORAL
  Filled 2014-09-19 (×3): qty 1

## 2014-09-19 MED ORDER — LABETALOL HCL 5 MG/ML IV SOLN: 10.0000 mg | INTRAVENOUS | Status: DC | PRN

## 2014-09-19 MED ORDER — DEXAMETHASONE SODIUM PHOSPHATE 10 MG/ML IJ SOLN
INTRAMUSCULAR | Status: AC
  Filled 2014-09-19: qty 1

## 2014-09-19 MED ORDER — PHENOL 1.4 % MT LIQD
1.0000 | OROMUCOSAL | Status: DC | PRN
Start: 2014-09-19 — End: 2014-09-21

## 2014-09-19 SURGICAL SUPPLY — 48 items
BANDAGE ELASTIC 4 VELCRO ST LF (GAUZE/BANDAGES/DRESSINGS) ×1 IMPLANT
BANDAGE ELASTIC 6 VELCRO ST LF (GAUZE/BANDAGES/DRESSINGS) ×3 IMPLANT
BANDAGE ESMARK 6X9 LF (GAUZE/BANDAGES/DRESSINGS) ×1 IMPLANT
BLADE SAW SAG 29X58X.64 (BLADE) ×3 IMPLANT
BNDG CMPR 9X6 STRL LF SNTH (GAUZE/BANDAGES/DRESSINGS) ×1
BNDG COHESIVE 6X5 TAN STRL LF (GAUZE/BANDAGES/DRESSINGS) ×3 IMPLANT
BNDG ESMARK 6X9 LF (GAUZE/BANDAGES/DRESSINGS) ×3
BNDG GAUZE ELAST 4 BULKY (GAUZE/BANDAGES/DRESSINGS) ×4 IMPLANT
CANISTER SUCTION 2500CC (MISCELLANEOUS) ×3 IMPLANT
CLIP TI MEDIUM 6 (CLIP) ×3 IMPLANT
COVER SURGICAL LIGHT HANDLE (MISCELLANEOUS) ×4 IMPLANT
COVER TABLE BACK 60X90 (DRAPES) ×3 IMPLANT
CUFF TOURNIQUET SINGLE 34IN LL (TOURNIQUET CUFF) ×2 IMPLANT
DRAIN CHANNEL 19F RND (DRAIN) IMPLANT
DRAPE ORTHO SPLIT 77X108 STRL (DRAPES) ×6
DRAPE PROXIMA HALF (DRAPES) ×3 IMPLANT
DRAPE SURG ORHT 6 SPLT 77X108 (DRAPES) ×2 IMPLANT
DRAPE U-SHAPE 47X51 STRL (DRAPES) ×1 IMPLANT
DRSG ADAPTIC 3X8 NADH LF (GAUZE/BANDAGES/DRESSINGS) ×3 IMPLANT
DRSG PAD ABDOMINAL 8X10 ST (GAUZE/BANDAGES/DRESSINGS) ×2 IMPLANT
ELECT REM PT RETURN 9FT ADLT (ELECTROSURGICAL) ×3
ELECTRODE REM PT RTRN 9FT ADLT (ELECTROSURGICAL) ×1 IMPLANT
EVACUATOR SILICONE 100CC (DRAIN) IMPLANT
GAUZE SPONGE 4X4 12PLY STRL (GAUZE/BANDAGES/DRESSINGS) ×4 IMPLANT
GLOVE BIO SURGEON STRL SZ7 (GLOVE) ×3 IMPLANT
GLOVE BIOGEL PI IND STRL 6.5 (GLOVE) IMPLANT
GLOVE BIOGEL PI IND STRL 7.5 (GLOVE) ×1 IMPLANT
GLOVE BIOGEL PI INDICATOR 6.5 (GLOVE) ×4
GLOVE BIOGEL PI INDICATOR 7.5 (GLOVE) ×2
GLOVE SURG SS PI 6.5 STRL IVOR (GLOVE) ×2 IMPLANT
GOWN STRL REUS W/ TWL LRG LVL3 (GOWN DISPOSABLE) ×3 IMPLANT
GOWN STRL REUS W/TWL LRG LVL3 (GOWN DISPOSABLE) ×9
KIT BASIN OR (CUSTOM PROCEDURE TRAY) ×3 IMPLANT
KIT ROOM TURNOVER OR (KITS) ×3 IMPLANT
NS IRRIG 1000ML POUR BTL (IV SOLUTION) ×3 IMPLANT
PACK GENERAL/GYN (CUSTOM PROCEDURE TRAY) ×3 IMPLANT
PAD ARMBOARD 7.5X6 YLW CONV (MISCELLANEOUS) ×6 IMPLANT
SPONGE GAUZE 4X4 12PLY STER LF (GAUZE/BANDAGES/DRESSINGS) ×2 IMPLANT
STAPLER VISISTAT 35W (STAPLE) ×3 IMPLANT
STOCKINETTE IMPERVIOUS LG (DRAPES) ×3 IMPLANT
SUT ETHILON 3 0 PS 1 (SUTURE) IMPLANT
SUT SILK 0 TIES 10X30 (SUTURE) ×3 IMPLANT
SUT SILK 2 0 (SUTURE) ×3
SUT SILK 2-0 18XBRD TIE 12 (SUTURE) ×1 IMPLANT
SUT VIC AB 2-0 CT1 18 (SUTURE) ×8 IMPLANT
SUT VIC AB 3-0 SH 18 (SUTURE) IMPLANT
UNDERPAD 30X30 INCONTINENT (UNDERPADS AND DIAPERS) ×3 IMPLANT
WATER STERILE IRR 1000ML POUR (IV SOLUTION) ×3 IMPLANT

## 2014-09-19 NOTE — Progress Notes (Signed)
Patient Name: Greg Davis Date of Encounter: 09/19/2014     Active Problems:   Atrial fibrillation with RVR   Acute encephalopathy   Left foot infection   PVD (peripheral vascular disease)   RUQ pain    SUBJECTIVE  Patient remains in NSR. Denies any chest pain or dyspnea. Awaiting vascular surgery by Dr. Imogene Burn today.  CURRENT MEDS . acetaminophen  650 mg Rectal Once  . amiodarone  400 mg Oral BID  . aspirin  81 mg Oral Daily  . atorvastatin  80 mg Oral q1800  . Chlorhexidine Gluconate Cloth  6 each Topical Q0600  . insulin aspart  0-9 Units Subcutaneous 6 times per day  . metoprolol succinate  25 mg Oral Daily  . mupirocin ointment  1 application Nasal BID  . sodium chloride  3 mL Intravenous Q12H  . vancomycin  1,000 mg Intravenous Q12H    OBJECTIVE  Filed Vitals:   09/19/14 0430 09/19/14 0532 09/19/14 0558 09/19/14 0756  BP:  130/76  124/97  Pulse:  83  89  Temp: 97.4 F (36.3 C)   97.7 F (36.5 C)  TempSrc: Oral   Oral  Resp:  13  21  Height:      Weight:   148 lb 9.6 oz (67.405 kg)   SpO2:  100%  100%    Intake/Output Summary (Last 24 hours) at 09/19/14 0915 Last data filed at 09/19/14 0659  Gross per 24 hour  Intake 606.12 ml  Output   1850 ml  Net -1243.88 ml   Filed Weights   09/17/14 0500 09/18/14 0640 09/19/14 0558  Weight: 147 lb (66.679 kg) 147 lb 9.6 oz (66.951 kg) 148 lb 9.6 oz (67.405 kg)    PHYSICAL EXAM  General: Pleasant, NAD. HEENT:  Blind in left eye  Neck: Supple without bruits or JVD. Lungs:  Resp regular and unlabored, CTA. Heart: RRR no s3, s4, or murmurs. Abdomen: Soft, non-tender, non-distended, BS + x 4.  Extremities: No phlebitis.  Accessory Clinical Findings  CBC  Recent Labs  09/18/14 0340 09/19/14 0241  WBC 10.0 11.5*  HGB 10.0* 10.2*  HCT 29.7* 30.7*  MCV 90.0 90.0  PLT 319 329   Basic Metabolic Panel  Recent Labs  09/17/14 0350 09/19/14 0241  NA 133* 135  K 4.0 4.4  CL 103 101  CO2 22 25    GLUCOSE 86 85  BUN 5* 10  CREATININE 0.68 0.82  CALCIUM 8.2* 8.9  MG 1.7  --   PHOS 3.5  --    Liver Function Tests No results for input(s): AST, ALT, ALKPHOS, BILITOT, PROT, ALBUMIN in the last 72 hours. No results for input(s): LIPASE, AMYLASE in the last 72 hours. Cardiac Enzymes No results for input(s): CKTOTAL, CKMB, CKMBINDEX, TROPONINI in the last 72 hours. BNP Invalid input(s): POCBNP D-Dimer No results for input(s): DDIMER in the last 72 hours. Hemoglobin A1C No results for input(s): HGBA1C in the last 72 hours. Fasting Lipid Panel No results for input(s): CHOL, HDL, LDLCALC, TRIG, CHOLHDL, LDLDIRECT in the last 72 hours. Thyroid Function Tests No results for input(s): TSH, T4TOTAL, T3FREE, THYROIDAB in the last 72 hours.  Invalid input(s): FREET3  TELE  NSR  ECG    Radiology/Studies  Ct Head Wo Contrast  09/15/2014   CLINICAL DATA:  Confusion, acute encephalopathy  EXAM: CT HEAD WITHOUT CONTRAST  TECHNIQUE: Contiguous axial images were obtained from the base of the skull through the vertex without intravenous contrast.  COMPARISON:  05/27/2013  FINDINGS: No skull fracture are noted. Stable lytic and sclerotic calvarial foci. 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 No intracranial hemorrhage, mass effect or midline shift. Ventricular size is stable from prior exam. No acute cortical infarction. Atherosclerotic calcifications of carotid siphon again noted. No mass lesion is noted on this unenhanced scan.  IMPRESSION: No acute intracranial abnormality. Stable mild cerebral atrophy. Stable chronic findings as described   Electronically Signed   By: Natasha MeadLiviu  Pop M.D.   On: 09/15/2014 16:04   Dg Chest Port 1 View  09/15/2014   CLINICAL DATA:  Tachycardia.  EXAM: PORTABLE CHEST - 1 VIEW  COMPARISON:  November 19, 2013.  FINDINGS: The heart size and mediastinal contours are within normal limits. Both lungs are clear. No pneumothorax or  pleural effusion is noted. Old left-sided rib fractures are noted. Degenerative change of left glenohumeral joint is noted.  IMPRESSION: No acute cardiopulmonary abnormality seen.   Electronically Signed   By: Lupita RaiderJames  Green Jr, M.D.   On: 09/15/2014 11:25   Dg Abd Portable 1v  09/16/2014   CLINICAL DATA:  Left upper quadrant abdominal pain.  EXAM: PORTABLE ABDOMEN - 1 VIEW  COMPARISON:  None.  FINDINGS: Paucity of bowel gas without definite evidence of obstruction.  Nondiagnostic evaluation for pneumoperitoneum secondary supine positioning exclusion a lower thorax. No definite pneumatosis or portal venous gas.  Ill-defined calcifications overlying the right upper abdominal quadrant may represent gallstones.  Vascular calcifications overlie the lower pelvis bilaterally.  No acute osseus abnormalities.  IMPRESSION: 1. Paucity of bowel gas without evidence of obstruction. 2. Ill-defined calcifications overlying the right upper quadrant may represent gallstones. Further evaluation with right upper quadrant abdominal ultrasound could be performed as clinically indicated   Electronically Signed   By: Simonne ComeJohn  Watts M.D.   On: 09/16/2014 01:45   [place] Abdomen Limited Ruq  09/16/2014   CLINICAL DATA:  Right upper quadrant pain for 3 days.  EXAM: US ABDOMEN LIMITED - RIGHT UPPER QUADRANT  COMPARISON:  None.  FINDINGS: Gallbladder:  Numerous small gallstones fill the gallbladder. These measure 10 mm or less in size. No wall thickening. Negative sonographic Murphy's.  Common bile duct:  Diameter: Normal caliber, 4-5 mm.  Liver:  No focal lesion identified. Within normal limits in parenchymal echogenicity.  IMPRESSION: Cholelithiasis.  No sonographic evidence of acute cholecystitis.   Electronically Signed   By: Charlett NoseKevin  Dover M.D.   On: 09/16/2014 11:58    ASSESSMENT AND PLAN 1. Paroxysmal atrial fibrillation, holding NSR on metoprolol and amiodarone.   2. PAD awaiting left AKA. 3. Cholelithiasis.  Signed, Cassell Clementhomas  Gionna Polak MD

## 2014-09-19 NOTE — Progress Notes (Signed)
Subjective: No acute events overnight. Denies SOB or CP. Reports pain in leg. Otherwise no new complaints.  Objective: Vital signs in last 24 hours: Filed Vitals:   09/19/14 0558 09/19/14 0756 09/19/14 0800 09/19/14 1156  BP:  124/97 131/94 126/72  Pulse:  89 88 80  Temp:  97.7 F (36.5 C)  98 F (36.7 C)  TempSrc:  Oral  Oral  Resp:  Height:      Weight: 148 lb 9.6 oz (67.405 kg)     SpO2:  100% 100% 96%   Weight change: 1 lb (0.454 kg)  Intake/Output Summary (Last 24 hours) at 09/19/14 1412 Last data filed at 09/19/14 1231  Gross per 24 hour  Intake 606.12 ml  Output   1975 ml  Net -1368.88 ml   Physical Exam: General: AA male, alert, cooperative, NAD. HEENT: Right eye w/ corneal clouding, left eye PRRL, EOMI. Moist mucus membranes. Neck: Full range of motion without pain, supple, no lymphadenopathy or carotid bruits Lungs: Clear to ascultation bilaterally, normal work of respiration, no wheezes, rales, rhonchi Heart: RRR, no murmurs, gallops, or rubs Abdomen: Soft, non-tender, non-distended, BS + Extremities: LLE w/ dressing in place. Neurologic: Alert & oriented x3, cranial nerves II-XII intact, strength grossly intact, sensation intact to light touch   Lab Results: Basic Metabolic Panel:  Recent Labs Lab 09/16/14 0504 09/17/14 0350 09/19/14 0241  NA 133* 133* 135  [name]* 4.0 4.4  CL 103 103 101  CO2 19* 22 25  GLUCOSE 83 86 85  BUN 7 5* 10  CREATININE 0.64 0.68 0.82  CALCIUM 7.9* 8.2* 8.9  MG 1.4* 1.7  --   PHOS  --  3.5  --    Liver Function Tests:  Recent Labs Lab 09/15/14 0804  AST 22  ALT 16*  ALKPHOS 84  BILITOT 0.9  PROT 7.9  ALBUMIN 3.2*   CBC:  Recent Labs Lab 09/15/14 1000  09/18/14 0340 09/19/14 0241  WBC 12.1*  < > 10.0 11.5*  NEUTROABS 9.5*  --   --   --   HGB 11.1*  < > 10.0* 10.2*  HCT 33.0*  < > 29.7* 30.7*  MCV 90.2  < > 90.0 90.0  PLT 304  < > 319 329  < > = values in this interval not  displayed. Cardiac Enzymes:  Recent Labs Lab 09/15/14 1715 09/15/14 2157 09/16/14 0504  TROPONINI 0.04* <0.03 <0.03   CBG:  Recent Labs Lab 09/18/14 1133 09/18/14 1631 09/19/14 0016 09/19/14 0425 09/19/14 0754 09/19/14 1229  GLUCAP 120* 107* 102* 132* 78 76   Fasting Lipid Panel:  Recent Labs Lab 09/15/14 0804  CHOL 120  HDL 28*  LDLCALC 77  TRIG 75  CHOLHDL 4.3   Thyroid Function Tests:  Recent Labs Lab 09/16/14 0504  TSH 0.781   Coagulation:  Recent Labs Lab 09/15/14 1000  LABPROT 15.8*  INR 1.25   Micro Results: Recent Results (from the past 240 hour(s))  Culture, blood (routine x 2)     Status: None (Preliminary result)   Collection Time: 09/16/14 10:29 AM  Result Value Ref Range Status   Specimen Description BLOOD RIGHT HAND  Final   Special Requests BOTTLES DRAWN AEROBIC ONLY  6CC  Final   Culture   Final           BLOOD CULTURE RECEIVED NO GROWTH TO DATE CULTURE WILL BE HELD FOR 5 DAYS BEFORE ISSUING A FINAL NEGATIVE REPORT Performed at [facility]    Report Status PENDING  Incomplete  Culture, blood (routine x 2)     Status: None (Preliminary result)   Collection Time: 09/16/14 10:37 AM  Result Value Ref Range Status   Specimen Description BLOOD LEFT HAND  Final   Special Requests BOTTLES DRAWN AEROBIC ONLY  5CC  Final   Culture   Final           BLOOD CULTURE RECEIVED NO GROWTH TO DATE CULTURE WILL BE HELD FOR 5 DAYS BEFORE ISSUING A FINAL NEGATIVE REPORT Performed at [facility]    Report Status PENDING  Incomplete  Surgical pcr screen     Status: Abnormal   Collection Time: 09/18/14  8:29 PM  Result Value Ref Range Status   MRSA, PCR POSITIVE (A) NEGATIVE Final    Comment: RESULT CALLED TO, READ BACK BY AND VERIFIED WITH: J.TSOUTIS,RN 2327 09/18/14 M.CAMPBELL    Staphylococcus aureus POSITIVE (A) NEGATIVE Final    Comment:        The Xpert SA Assay (FDA approved for NASAL specimens in patients over 21 years  of age), is one component of a comprehensive surveillance program.  Test performance has been validated by [facility] for patients greater than or equal to 53 year old. It is not intended to diagnose infection nor to guide or monitor treatment.    Studies/Results: No results found. Medications: I have reviewed the patient's current medications. Scheduled Meds: . acetaminophen  650 mg Rectal Once  . amiodarone  400 mg Oral BID  . aspirin  81 mg Oral Daily  . atorvastatin  80 mg Oral q1800  . Chlorhexidine Gluconate Cloth  6 each Topical Q0600  . insulin aspart  0-9 Units Subcutaneous 6 times per day  . metoprolol succinate  25 mg Oral Daily  . mupirocin ointment  1 application Nasal BID  . sodium chloride  3 mL Intravenous Q12H  . vancomycin  1,000 mg Intravenous Q12H   Continuous Infusions:   PRN Meds:.HYDROcodone-acetaminophen, ondansetron (ZOFRAN) IV   Assessment/Plan: 59 y/o M w/ PMHx of DM type II, anemia, right eye blindness, h/o alcohol abuse, PVD, and chronic left foot wound, admitted for atrial fibrillation w/ RVR.   Atrial fibrillation with RVR: Went briefly into A fib this morning with rate in the 140s. Converted to sinus in the 80s with  IV metoprolol x 1. No symptoms.  -Cardiology following, appreciate recommendations -Toprol-XL 25 mg daily.  -Amiodarone 400 mg bid -Heparin gtt held -Telemetry  PVD w/ Left Foot Gangrene: Blood cultures w/ no growth to date.  -Continue ASA  daily  -Continue Vancomycin IV for now -Lipitor 80 mg qhs -Heparin as above -Vicodin prn for pain -Benadryl prn for mild anxiety -OR with Vascular surgery today  Acute encephalopathy: Seems to be at his baseline.  -Continue to monitor  Chronic anemia: Stable.  -CBC in AM  DM Type II: No medications for DM. -ISS -HbA1c pending  DVT/PE PPx:: Heparin gtt  Dispo: Disposition is deferred at this time, awaiting improvement of current medical problems.  Anticipated discharge  in approximately 2-3 day(s).   The patient does have a current PCP (No primary care provider on file.) and does not need an [facility] hospital follow-up appointment after discharge.  The patient does not have transportation limitations that hinder transportation to clinic appointments.  .Services Needed at time of discharge: Y = Yes, Blank = No PT:   OT:   RN:   Equipment:   Other:  LOS: 4 days   Lora PaulaJennifer T Shereda Graw, MD 09/19/2014, 2:12 PM

## 2014-09-19 NOTE — Anesthesia Preprocedure Evaluation (Signed)
Anesthesia Evaluation  Patient identified by MRN, date of birth, ID band Patient awake    Reviewed: Allergy & Precautions, NPO status , Patient's Chart, lab work & pertinent test results  Airway Mallampati: II   Neck ROM: full    Dental   Pulmonary Current Smoker,  breath sounds clear to auscultation        Cardiovascular + Peripheral Vascular Disease Rhythm:regular Rate:Normal     Neuro/Psych    GI/Hepatic (+) Cirrhosis -    substance abuse  alcohol use,   Endo/Other  diabetes, Type 2  Renal/GU      Musculoskeletal   Abdominal   Peds  Hematology   Anesthesia Other Findings   Reproductive/Obstetrics                             Anesthesia Physical Anesthesia Plan  ASA: III  Anesthesia Plan: General   Post-op Pain Management:    Induction: Intravenous  Airway Management Planned: LMA  Additional Equipment:   Intra-op Plan:   Post-operative Plan:   Informed Consent: I have reviewed the patients History and Physical, chart, labs and discussed the procedure including the risks, benefits and alternatives for the proposed anesthesia with the patient or authorized representative who has indicated his/her understanding and acceptance.     Plan Discussed with: CRNA, Anesthesiologist and Surgeon  Anesthesia Plan Comments:         Anesthesia Quick Evaluation

## 2014-09-19 NOTE — Progress Notes (Addendum)
ANTICOAGULATION CONSULT NOTE Pharmacy Consult for Heparin  Indication: atrial fibrillation  No Known Allergies  Patient Measurements: Height: 5' 9.5" (176.5 cm) Weight: 148 lb 9.6 oz (67.405 kg) IBW/kg (Calculated) : 71.85  Vital Signs: Temp: 97.4 F (36.3 C) (05/30 0430) Temp Source: Oral (05/30 0430) BP: 130/76 mmHg (05/30 0532) Pulse Rate: 83 (05/30 0532)  Labs:  Recent Labs  09/17/14 0350  09/17/14 1715 09/18/14 0340 09/19/14 0241  HGB 9.9*  --   --  10.0* 10.2*  HCT 29.4*  --   --  29.7* 30.7*  PLT 295  --   --  319 329  HEPARINUNFRC 0.16*  < > 0.58 0.49 0.50  CREATININE 0.68  --   --   --  0.82  < > = values in this interval not displayed.  Estimated Creatinine Clearance: 93.6 mL/min (by C-G formula based on Cr of 0.82).  Assessment: 59 y.o. male presented to ED on 09/15/2014 with afib with RVR. Pharmacy has been consulted to dose heparin.  HL remains therapeutic at 0.5. Plt wnl, H/H low but remains stable today with no reported significant s/s bleeding.   Of note, per cards, patient is not likely a good long term anticoagulation candidate due to ongoing EtOH use.  Heparin turned off this am for L AKA today 09/19/2014.  ID: Vanc for L foot wound infection, afeb, WBC wnl, VVS for aortogram 5/27. Plan for L AKA today 09/19/2014, wbc up slightly to 11.5. Renal function stable.  5/27 bld x2 - ngtd  Will follow up post surgery and plan on checking vancomycin level tomorrow if abx continued.  Goal of Therapy:  Heparin level 0.3-0.7 units/ml Monitor platelets by anticoagulation protocol: Yes   Plan:  F/u plans to continue anticoag post surgery today  No changes in vancomycin today - check level tomorrow if abx continued  Sheppard CoilFrank Wilson PharmD., BCPS Clinical Pharmacist Pager [phone] 09/19/2014 7:45 AM   Addendum: Patient is now s/p left AKA. Heparin to resume tonight at 2145. Will resume at previous rate of 1750 units/hr and check 6 hour heparin  level.  Louie CasaJennifer Alexi Dorminey, PharmD, BCPS 09/19/2014, 5:42 PM

## 2014-09-19 NOTE — Transfer of Care (Signed)
Immediate Anesthesia Transfer of Care Note  Patient: Greg GanjaAlbert Davis  Procedure(s) Performed: Procedure(s): AMPUTATION ABOVE KNEE (Left)  Patient Location: PACU  Anesthesia Type:General  Level of Consciousness: awake, alert , oriented and patient cooperative  Airway & Oxygen Therapy: Patient Spontanous Breathing  Post-op Assessment: Report given to RN and Post -op Vital signs reviewed and stable  Post vital signs: Reviewed and stable  Last Vitals:  Filed Vitals:   09/19/14 1558  BP: 91/56  Pulse: 69  Temp:   Resp: 11    Complications: No apparent anesthesia complications

## 2014-09-19 NOTE — Anesthesia Postprocedure Evaluation (Signed)
Anesthesia Post Note  Patient: Greg Davis  Procedure(s) Performed: Procedure(s) (LRB): AMPUTATION ABOVE KNEE (Left)  Anesthesia type: General  Patient location: PACU  Post pain: Pain level controlled and Adequate analgesia  Post assessment: Post-op Vital signs reviewed, Patient's Cardiovascular Status Stable, Respiratory Function Stable, Patent Airway and Pain level controlled  Last Vitals:  Filed Vitals:   09/19/14 1615  BP:   Pulse: 70  Temp:   Resp: 8    Post vital signs: Reviewed and stable  Level of consciousness: awake, alert  and oriented  Complications: No apparent anesthesia complications

## 2014-09-19 NOTE — Op Note (Signed)
    OPERATIVE NOTE   PROCEDURE: left above-the-knee amputation  PRE-OPERATIVE DIAGNOSIS: left foot gangrene  POST-OPERATIVE DIAGNOSIS: same as above  SURGEON: Leonides SakeBrian Lorne Winkels, MD  ASSISTANT(S): Doreatha MassedSamantha Rhyne, PAC   ANESTHESIA: general  ESTIMATED BLOOD LOSS: 50 cc  FINDING(S): Viable thigh muscle  SPECIMEN(S):  left above-the-knee amputation  INDICATIONS:   Greg Davis is a 59 y.o. male who presents with left foot gangrene.  The patient is scheduled for a left above-the-knee amputation.  I discussed in depth with the patient the risks, benefits, and alternatives to this procedure.  The patient is aware that the risk of this operation included but are not limited to:  bleeding, infection, myocardial infarction, stroke, death, failure to heal amputation wound, and possible need for more proximal amputation.  The patient is aware of the risks and agrees proceed forward with the procedure.  DESCRIPTION: After full informed written consent was obtained from the patient, the patient was brought back to the operating room, and placed supine upon the operating table.  Prior to induction, the patient received IV antibiotics.  The patient was then prepped and draped in the standard fashion for an above-the-knee amputation.  I placed a non-sterile tourniquet on the thigh prior to the procedure.  After obtaining adequate anesthesia, the patient was prepped and draped in the standard fashion for a above-the-knee amputation.  I marked out the anterior and posterior flaps for a fish-mouth type of amputation.  I exsanguinated the leg with an Esmarch bandage and then inflated the tourniquet to 250 mm Hg.   I made the incisions for these flaps, and then dissected through the subcutaneous tissue, fascia, and muscles circumferentially.  I elevated  the periosteal tissue 4 cm more proximal than the anterior skin flap.  I then transected the femur with a power saw at this level.  Then I smoothed out the rough  edges of the bone with a rasp.  At this point, the specimen was passed off the field as the above-the-knee amputation.  At this point, I clamped all visibly bleeding arteries and veins using a combination of suture ligation with Vicryl suture and electrocautery.  The tourniquet was then deflated at this point.  Bleeding continued to be controlled with electrocautery and suture ligature.  The stump was washed off with sterile normal saline and no further active bleeding was noted.  I reapproximated the anterior and posterior fascia  with interrupted stitches of 2-0 Vicryl.  This was completed along the entire length of anterior and posterior fascia until there were no more loose space in the fascial line.  The skin was then  reapproximated with staples.  The stump was washed off and dried.  The incision was dressed with [employer] and  then fluffs were applied.  Kerlix was wrapped around the leg and then gently an ACE wrap was applied.     COMPLICATIONS: none  CONDITION: stable   Leonides SakeBrian Shashank Kwasnik, MD [facility] [place] Office: [phone] Pager: [phone]  09/19/2014, 3:46 PM

## 2014-09-19 NOTE — Interval H&P Note (Signed)
[facility]  History and Physical Update  The patient was interviewed and re-examined.  The patient's previous History and Physical has been reviewed and is unchanged from my consult except for: interval development of afib with RVR.  The patient underwent angiogram which demonstrated poor revascularization options in Left leg.  I recommended L AKA.  The patient agreed to proceed.  I discussed in depth the nature of above-the-knee amputation with the patient, including risks, benefits, and alternatives.  The patient is aware that the risks of above-the-knee amputation include but are not limited to: bleeding, infection, myocardial infarction, stroke, death, failure to heal amputation wound, and possible need for more proximal amputation.  The patient is aware of the risks and agrees proceed forward with the procedure.  Leonides SakeBrian Chen, MD [facility] [place] Office: [phone] Pager: [phone]  09/19/2014, 2:21 PM

## 2014-09-20 ENCOUNTER — Encounter (HOSPITAL_COMMUNITY): Payer: Self-pay | Admitting: Vascular Surgery

## 2014-09-20 DIAGNOSIS — S78112A Complete traumatic amputation at level between left hip and knee, initial encounter: Secondary | ICD-10-CM

## 2014-09-20 DIAGNOSIS — D649 Anemia, unspecified: Secondary | ICD-10-CM | POA: Diagnosis present

## 2014-09-20 DIAGNOSIS — R7303 Prediabetes: Secondary | ICD-10-CM | POA: Diagnosis present

## 2014-09-20 HISTORY — DX: Prediabetes: R73.03

## 2014-09-20 HISTORY — DX: Complete traumatic amputation at level between left hip and knee, initial encounter: S78.112A

## 2014-09-20 LAB — CBC
HCT: 30.8 % — ABNORMAL LOW (ref 39.0–52.0)
HCT: 29.7 % — ABNORMAL LOW (ref 39.0–52.0)
HEMOGLOBIN: 10.3 g/dL — AB (ref 13.0–17.0)
HEMOGLOBIN: 9.7 g/dL — AB (ref 13.0–17.0)
MCH: 30.1 pg (ref 26.0–34.0)
MCH: 29.8 pg (ref 26.0–34.0)
MCHC: 33.4 g/dL (ref 30.0–36.0)
MCHC: 32.7 g/dL (ref 30.0–36.0)
MCV: 90.1 fL (ref 78.0–100.0)
MCV: 91.1 fL (ref 78.0–100.0)
Platelets: 380 10*3/uL (ref 150–400)
Platelets: 368 10*3/uL (ref 150–400)
RBC: 3.42 MIL/uL — ABNORMAL LOW (ref 4.22–5.81)
RBC: 3.26 MIL/uL — ABNORMAL LOW (ref 4.22–5.81)
RDW: 13.1 % (ref 11.5–15.5)
RDW: 13.3 % (ref 11.5–15.5)
WBC: 9.4 10*3/uL (ref 4.0–10.5)
WBC: 13.4 10*3/uL — ABNORMAL HIGH (ref 4.0–10.5)

## 2014-09-20 LAB — CREATININE, SERUM
Creatinine, Ser: 0.87 mg/dL (ref 0.61–1.24)
GFR calc Af Amer: >60 mL/min (ref >60)
GFR calc non Af Amer: >60 mL/min (ref >60)

## 2014-09-20 LAB — GLUCOSE, CAPILLARY
GLUCOSE-CAPILLARY: 128 mg/dL — AB (ref 65–99)
Glucose-Capillary: 146 mg/dL — ABNORMAL HIGH (ref 65–99)
Glucose-Capillary: 105 mg/dL — ABNORMAL HIGH (ref 65–99)
Glucose-Capillary: 102 mg/dL — ABNORMAL HIGH (ref 65–99)

## 2014-09-20 LAB — HEPARIN LEVEL (UNFRACTIONATED): Heparin Unfractionated: 0.32 IU/mL (ref 0.30–0.70)

## 2014-09-20 LAB — HEMOGLOBIN A1C
HEMOGLOBIN A1C: 6.1 % — AB (ref 4.8–5.6)
Mean Plasma Glucose: 128 mg/dL

## 2014-09-20 MED ORDER — HEPARIN SODIUM (PORCINE) 5000 UNIT/ML IJ SOLN
5000.0000 [IU] | Freq: Three times a day (TID) | INTRAMUSCULAR | Status: DC
  Administered 2014-09-20 – 2014-09-21 (×4): 5000 [IU] via SUBCUTANEOUS
  Filled 2014-09-20 (×4): qty 1

## 2014-09-20 MED ORDER — INSULIN ASPART 100 UNIT/ML ~~LOC~~ SOLN: 0.0000 [IU] | Freq: Three times a day (TID) | SUBCUTANEOUS | Status: DC

## 2014-09-20 MED ORDER — INSULIN ASPART 100 UNIT/ML ~~LOC~~ SOLN
0.0000 [IU] | Freq: Three times a day (TID) | SUBCUTANEOUS | Status: DC
Start: 2014-09-21 — End: 2014-09-21

## 2014-09-20 MED ORDER — INSULIN ASPART 100 UNIT/ML ~~LOC~~ SOLN: 0.0000 [IU] | Freq: Every day | SUBCUTANEOUS | Status: DC

## 2014-09-20 MED FILL — Lidocaine HCl Local Preservative Free (PF) Inj 1%: INTRAMUSCULAR | Qty: 30 | Status: AC

## 2014-09-20 MED FILL — Heparin Sodium (Porcine) 2 Unit/ML in Sodium Chloride 0.9%: INTRAMUSCULAR | Qty: 1000 | Status: AC

## 2014-09-20 NOTE — Evaluation (Signed)
Occupational Therapy Evaluation Patient Details Name: Greg Davis MRN: [id number] DOB: 26-Dec-1955 Today's Date: 09/20/2014    History of Present Illness Mr. Greg Davis is a 59 year old man with history of T2DM, cirrhosis, alcohol dependence, L eye blindness presenting with a fib with RVR. He was evaluated by vascular surgery  for LLE critical limb ischemia with left foot gangrene. He presented for aortogram and upon being placed on the angiography table and he became tachycardic and tachypneic. He was found to be in a fib with RVR. Per ED note, he had denied history of prior arrhythmias. 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  He is doing very well POD #1.  [facility] with plans to return there for rehab.  Currently, he is able to perform bed mobility mod I and min -mod A for ADLs at bed level.  Pt is very eager to return to SNF.  Will defer further OT to SNF.     Follow Up Recommendations  SNF    Equipment Recommendations  None recommended by OT    Recommendations for Other Services       Precautions / Restrictions Precautions Precautions: Fall Precaution Comments: monitor BP, very low      Mobility Bed Mobility Overal bed mobility: Modified Independent                Transfers Overall transfer level: Needs assistance   Transfers: Licensed conveyancerAnterior-Posterior Transfer       Anterior-Posterior transfers: Min guard   General transfer comment: cues for technique on A-P , patient  able to scoot self , turn  on bed and back into bed    Balance Overall balance assessment: Needs assistance Sitting-balance support: Feet supported Sitting balance-Leahy Scale: Fair                                      ADL Overall ADL's : Needs assistance/impaired Eating/Feeding: Independent   Grooming: Wash/dry hands;Wash/dry face;Oral care;Brushing hair;Set up;Sitting   Upper Body Bathing: Set up;Sitting;Bed level   Lower Body  Bathing: Moderate assistance;Bed level   Upper Body Dressing : Set up;Sitting;Bed level   Lower Body Dressing: Moderate assistance;Bed level   Toilet Transfer: Min guard;Anterior/posterior;BSC   Toileting- ArchitectClothing Manipulation and Hygiene: Moderate assistance;Bed level;Sitting/lateral lean       Functional mobility during ADLs: Min guard (ant/post transfers )       Vision     Perception     Praxis      Pertinent Vitals/Pain Pain Assessment: 0-10 Pain Score: 8  Pain Location: Lt residual limb  Pain Descriptors / Indicators: Constant;Discomfort;Sharp Pain Intervention(s): Monitored during session     Hand Dominance     Extremity/Trunk Assessment Upper Extremity Assessment Upper Extremity Assessment: Overall WFL for tasks assessed   Lower Extremity Assessment Lower Extremity Assessment: Defer to PT evaluation LLE Deficits / Details: L aka w/dressing   Cervical / Trunk Assessment Cervical / Trunk Assessment: Normal   Communication Communication Communication: No difficulties   Cognition Arousal/Alertness: Awake/alert Behavior During Therapy: WFL for tasks assessed/performed Overall Cognitive Status: Within Functional Limits for tasks assessed                     General Comments       Exercises       Shoulder Instructions      Home Living Family/patient expects  to be discharged to:: Skilled nursing facility                                        Prior Functioning/Environment Level of Independence: Needs assistance  Gait / Transfers Assistance Needed: walked some with RW, mod. I w/WC transfer and propelling, has a  facility WC. ADL's / Homemaking Assistance Needed: Able to perform ADLs after set up         OT Diagnosis: Generalized weakness;Acute pain   OT Problem List: Impaired balance (sitting and/or standing);Decreased knowledge of use of DME or AE;Decreased knowledge of precautions;Pain   OT Treatment/Interventions:       OT Goals(Current goals can be found in the care plan section) Acute Rehab OT Goals Patient Stated Goal: to go back to rehab OT Goal Formulation: All assessment and education complete, DC therapy  OT Frequency:     Barriers to D/C:            Co-evaluation              End of Session Nurse Communication: Mobility status  Activity Tolerance: Patient tolerated treatment well Patient left: in chair;with call bell/phone within reach   Time: 0922-0937 OT Time Calculation (min): 15 min Charges:  OT General Charges $OT Visit: 1 Procedure OT Evaluation $Initial OT Evaluation Tier I: 1 Procedure G-Codes:    Yi Haugan, Ursula Alert M 10-11-2014, 1:08 PM

## 2014-09-20 NOTE — Progress Notes (Addendum)
Subjective: No acute events overnight. Status post left AKA. Pain controlled. Tolerating diet. Denies chest pain or shortness of breath.  Objective: Vital signs in last 24 hours: Filed Vitals:   09/20/14 1110 09/20/14 1120 09/20/14 1140 09/20/14 1210  BP: 90/51  Pulse: 67 67 65 66  Temp:  98.1 F (36.7 C)    TempSrc:  Oral    Resp: Height:      Weight:      SpO2: 98% 99% 100% 99%   Weight change:   Intake/Output Summary (Last 24 hours) at 09/20/14 1400 Last data filed at 09/20/14 1319  Gross per 24 hour  Intake 2795.47 ml  Output   3090 ml  Net -294.53 ml   Physical Exam: General: AA male, alert, cooperative, NAD. HEENT: Right eye w/ corneal clouding, left eye PRRL, EOMI. Moist mucus membranes. Neck: Full range of motion without pain, supple, no lymphadenopathy or carotid bruits Lungs: Clear to ascultation bilaterally, normal work of respiration, no wheezes, rales, rhonchi Heart: RRR, no murmurs, gallops, or rubs Abdomen: Soft, non-tender, non-distended, BS + Extremities: Left AKA with dressing in place.  Neurologic: Alert & oriented x3, cranial nerves II-XII intact, strength grossly intact, sensation intact to light touch   Lab Results: Basic Metabolic Panel:  Recent Labs Lab 09/16/14 0504 09/17/14 0350 09/19/14 0241  NA 133* 133* 135  [name]* 4.0 4.4  CL 103 103 101  CO2 19* 22 25  GLUCOSE 83 86 85  BUN 7 5* 10  CREATININE 0.64 0.68 0.82  CALCIUM 7.9* 8.2* 8.9  MG 1.4* 1.7  --   PHOS  --  3.5  --    Liver Function Tests:  Recent Labs Lab 09/15/14 0804  AST 22  ALT 16*  ALKPHOS 84  BILITOT 0.9  PROT 7.9  ALBUMIN 3.2*   CBC:  Recent Labs Lab 09/15/14 1000  09/19/14 0241 09/20/14 0300  WBC 12.1*  < > 11.5* 9.4  NEUTROABS 9.5*  --   --   --   HGB 11.1*  < > 10.2* 10.3*  HCT 33.0*  < > 30.7* 30.8*  MCV 90.2  < > 90.0 90.1  PLT 304  < > 329 380  < > = values in this interval not displayed. Cardiac  Enzymes:  Recent Labs Lab 09/15/14 1715 09/15/14 2157 09/16/14 0504  TROPONINI 0.04* <0.03 <0.03   CBG:  Recent Labs Lab 09/19/14 1229 09/19/14 1601 09/19/14 1734 09/19/14 2136 09/20/14 0719 09/20/14 1118  GLUCAP 76 68 124* 133* 146* 128*   Fasting Lipid Panel:  Recent Labs Lab 09/15/14 0804  CHOL 120  HDL 28*  LDLCALC 77  TRIG 75  CHOLHDL 4.3   Thyroid Function Tests:  Recent Labs Lab 09/16/14 0504  TSH 0.781   Coagulation:  Recent Labs Lab 09/15/14 1000  LABPROT 15.8*  INR 1.25   Micro Results: Recent Results (from the past 240 hour(s))  Culture, blood (routine x 2)     Status: None (Preliminary result)   Collection Time: 09/16/14 10:29 AM  Result Value Ref Range Status   Specimen Description BLOOD RIGHT HAND  Final   Special Requests BOTTLES DRAWN AEROBIC ONLY  6CC  Final   Culture   Final           BLOOD CULTURE RECEIVED NO GROWTH TO DATE CULTURE WILL BE HELD FOR 5 DAYS BEFORE ISSUING A FINAL NEGATIVE REPORT Performed at [facility]    Report  Status PENDING  Incomplete  Culture, blood (routine x 2)     Status: None (Preliminary result)   Collection Time: 09/16/14 10:37 AM  Result Value Ref Range Status   Specimen Description BLOOD LEFT HAND  Final   Special Requests BOTTLES DRAWN AEROBIC ONLY  5CC  Final   Culture   Final           BLOOD CULTURE RECEIVED NO GROWTH TO DATE CULTURE WILL BE HELD FOR 5 DAYS BEFORE ISSUING A FINAL NEGATIVE REPORT Performed at [facility]    Report Status PENDING  Incomplete  Surgical pcr screen     Status: Abnormal   Collection Time: 09/18/14  8:29 PM  Result Value Ref Range Status   MRSA, PCR POSITIVE (A) NEGATIVE Final    Comment: RESULT CALLED TO, READ BACK BY AND VERIFIED WITH: J.TSOUTIS,RN 2327 09/18/14 M.CAMPBELL    Staphylococcus aureus POSITIVE (A) NEGATIVE Final    Comment:        The Xpert SA Assay (FDA approved for NASAL specimens in patients over 31 years of age), is one  component of a comprehensive surveillance program.  Test performance has been validated by [facility] for patients greater than or equal to 38 year old. It is not intended to diagnose infection nor to guide or monitor treatment.    Studies/Results: No results found. Medications: I have reviewed the patient's current medications. Scheduled Meds: . acetaminophen  650 mg Rectal Once  . amiodarone  400 mg Oral BID  . aspirin  81 mg Oral Daily  . atorvastatin  80 mg Oral q1800  . Chlorhexidine Gluconate Cloth  6 each Topical Q0600  . docusate sodium  100 mg Oral Daily  . heparin  5,000 Units Subcutaneous 3 times per day  . insulin aspart  0-9 Units Subcutaneous 6 times per day  . metoprolol succinate  25 mg Oral Daily  . mupirocin ointment  1 application Nasal BID  . pantoprazole  40 mg Oral Daily  . sodium chloride  3 mL Intravenous Q12H   Continuous Infusions: . sodium chloride Stopped (09/20/14 0555)   PRN Meds:.acetaminophen **OR** acetaminophen, alum & mag hydroxide-simeth, guaiFENesin-dextromethorphan, hydrALAZINE, HYDROcodone-acetaminophen, labetalol, metoprolol, morphine injection, phenol   Assessment/Plan: 59 y/o M w/ PMHx of DM type II, anemia, right eye blindness, h/o alcohol abuse, PVD, and chronic left foot wound, admitted for atrial fibrillation w/ RVR.   Atrial fibrillation with RVR: Remains in sinus. No symptoms.  -Cardiology following, appreciate recommendations -Toprol-XL 25 mg daily.  -Amiodarone 400 mg bid -Heparin gtt discontinued as per cardiology note 09/16/2014: he will not be a good long-term candidate for anticoagulation. -Telemetry  PVD w/ Left Foot Gangrene: Blood cultures w/ no growth to date. Status post left AKA on 09/19/2014. Evaluated by PT/OT: Recommend SNF. -Vascular surgery following, appreciate recommendations -Continue ASA  daily  -Discontinue IV vancomycin -Lipitor 80 mg qhs -Vicodin prn for pain -Benadryl prn for mild  anxiety  Acute encephalopathy: Seems to be at his baseline.  -Continue to monitor  Chronic anemia: Stable.  -CBC in AM  DM Type II: No medications for DM. -ISS -HbA1c pending  DVT/PE PPx:: Subcutaneous Heparin   Dispo: Disposition is deferred at this time, awaiting improvement of current medical problems.  Anticipated discharge in approximately 1-2 day(s).   The patient does have a current PCP (No primary care provider on file.) and does not need an [facility] hospital follow-up appointment after discharge.  The patient does not have transportation limitations that  hinder transportation to clinic appointments.  .Services Needed at time of discharge: Y = Yes, Blank = No PT:   OT:   RN:   Equipment:   Other:     LOS: 5 days   Lora PaulaJennifer T Tomy Khim, MD 09/20/2014, 2:00 PM

## 2014-09-20 NOTE — Progress Notes (Signed)
Physical medicine rehabilitation consult requested chart reviewed. [facility] a [facility] times the past 7 months. Plan is to return to skilled nursing facility. Hold on formal rehabilitation consult at this time planning skilled nursing facility

## 2014-09-20 NOTE — Progress Notes (Signed)
Patient Name: Greg Davis Date of Encounter: 09/20/2014  Active Problems:   Atrial fibrillation with RVR   Acute encephalopathy   Left foot infection   PVD (peripheral vascular disease)   RUQ pain  SUBJECTIVE  Denies chest pain, SOB or palpitation.   CURRENT MEDS . acetaminophen  650 mg Rectal Once  . amiodarone  400 mg Oral BID  . aspirin  81 mg Oral Daily  . atorvastatin  80 mg Oral q1800  . Chlorhexidine Gluconate Cloth  6 each Topical Q0600  . docusate sodium  100 mg Oral Daily  . insulin aspart  0-9 Units Subcutaneous 6 times per day  . metoprolol succinate  25 mg Oral Daily  . mupirocin ointment  1 application Nasal BID  . pantoprazole  40 mg Oral Daily  . sodium chloride  3 mL Intravenous Q12H  . vancomycin  1,000 mg Intravenous Q12H    OBJECTIVE  Filed Vitals:   09/20/14 0557 09/20/14 0633 09/20/14 0735 09/20/14 0850  BP:  114/61  104/52  Pulse: 74 77    Temp: 97.3 F (36.3 C)  98.2 F (36.8 C)   TempSrc: Tympanic  Oral   Resp: 16 14    Height:      Weight:      SpO2: 100% 97%      Intake/Output Summary (Last 24 hours) at 09/20/14 0919 Last data filed at 09/20/14 0813  Gross per 24 hour  Intake 2795.47 ml  Output   3190 ml  Net -394.53 ml   Filed Weights   09/17/14 0500 09/18/14 0640 09/19/14 0558  Weight: 147 lb (66.679 kg) 147 lb 9.6 oz (66.951 kg) 148 lb 9.6 oz (67.405 kg)    PHYSICAL EXAM  General: Pleasant, NAD. Neuro: Alert and oriented X 3. Moves all extremities spontaneously. Psych: Normal affect. HEENT:  Normal. Blind in left eye  Neck: Supple without bruits or JVD. Lungs:  Resp regular and unlabored, CTA. Heart: RRR no s3, s4, or murmurs. Abdomen: Soft, non-tender, non-distended, BS + x 4.  Extremities: No clubbing, cyanosis or edema. DR Radials 2+ right LE. LLE AKA amputation with dressing in place.   Accessory Clinical Findings  CBC  Recent Labs  09/19/14 0241 09/20/14 0300  WBC 11.5* 9.4  HGB 10.2* 10.3*  HCT  30.7* 30.8*  MCV 90.0 90.1  PLT 329 380   Basic Metabolic Panel  Recent Labs  09/19/14 0241  NA 135  K 4.4  CL 101  CO2 25  GLUCOSE 85  BUN 10  CREATININE 0.82  CALCIUM 8.9    TELE  NSR with rate of 70s. Occasional PVCs and missed beats.    2D echo 09/16/14 Study Conclusions  - Left ventricle: The cavity size was normal. Wall thickness was normal. Systolic function was normal. The estimated ejection fraction was in the range of 55% to 60%. Wall motion was normal; there were no regional wall motion abnormalities. Left ventricular diastolic function parameters were normal for the patient&'s age. - Aortic valve: Mildly calcified annulus. Trileaflet. - Mitral valve: Mildly thickened leaflets . There was mild regurgitation. - Right atrium: The atrium was at the upper limits of normal in size. Central venous pressure (est): 3 mm Hg. - Tricuspid valve: There was mild-moderate regurgitation. - Pulmonary arteries: Systolic pressure was mildly increased. PA peak pressure: 40 mm Hg (S). - Pericardium, extracardiac: There was no pericardial effusion.  Impressions:  - Normal LV wall thickness with LVEF 55-60%. Normal diastolic function. Mildly thickened mitral  leaflets with mild mitral regurgitation. Upper normal right atrial size. Mild to moderate tricuspid regurgitation with PASP 40 mmHg.   ASSESSMENT AND PLAN 59 y/o M w/ PMHx of DM type II, anemia, right eye blindness, h/o alcohol abuse, PVD, and chronic left foot wound, admitted 5/26 for atrial fibrillation w/ RVR and acute encephalopathy.   1. Afib with RVR - Converted spontaneously to sinus rhythm 5/27. On Amio 400mg  BID and Toprol XL 25mg .  - CHADSVASc score is at least 2 (DM and vascular disease).  - Continue ASA and heparin.  2. PAD w/ left foot gangrane - Left AKA done yesterday - Continue Lipitor 80mg .  - On vancomycin  3. DM type II - On ISS - Pending HgbA1c  4.  Chronic  Anemia - Stable  - Management per primary  5. Acute encephalopathy. - Improved.   Signed, Manson PasseyBhagat,Bhavinkumar PA-C Pager [phone]   The patient was seen, examined and discussed with Azalee CourseHao Meng, PA-C and I agree with the above.   The patient is holding NSR on metoprolol and amiodarone. PAD post left AKA yesterday. We will follow.  Lars MassonELSON, Ralph Benavidez H 09/20/2014

## 2014-09-20 NOTE — Evaluation (Addendum)
Physical Therapy Evaluation Patient Details Name: Greg Davis MRN: [id number] DOB: October 17, 1955 Today's Date: 09/20/2014   History of Present Illness  Mr. Greg Ganjalbert Beets is a 59 year old man with history of T2DM, cirrhosis, alcohol dependence, L eye blindness presenting with a fib with RVR. He was evaluated by vascular surgery  for LLE critical limb ischemia with left foot gangrene. He presented for aortogram and upon being placed on the angiography table and he became tachycardic and tachypneic. He was found to be in a fib with RVR. Per ED note, he had denied history of prior arrhythmias. 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 Patient will benefit from PT while in acute care to address problems listed in not below. BP 80'/40's but no c/o dizziness.    Follow Up Recommendations SNF;Supervision - Intermittent    Equipment Recommendations  None recommended by PT    Recommendations for Other Services       Precautions / Restrictions Precautions Precautions: Fall Restrictions Weight Bearing Restrictions: Yes  Monitor BP, low    Mobility  Bed Mobility Overal bed mobility: Modified Independent                Transfers Overall transfer level: Needs assistance   Transfers: Licensed conveyancerAnterior-Posterior Transfer       Anterior-Posterior transfers: Min guard   General transfer comment: cues for technique on A-P , patient  able to scoot self , turn  on bed and back into bed  Ambulation/Gait             General Gait Details: patient declined to stand today  Stairs            Wheelchair Mobility    Modified Rankin (Stroke Patients Only)       Balance Overall balance assessment: Needs assistance Sitting-balance support: Feet supported;No upper extremity supported Sitting balance-Leahy Scale: Fair                                       Pertinent Vitals/Pain Pain  Assessment: 0-10 Pain Score: 8  Pain Location: L residual limb Pain Descriptors / Indicators: Discomfort;Sharp Pain Intervention(s): Monitored during session;Premedicated before session;Repositioned    Home Living Family/patient expects to be discharged to:: Skilled nursing facility                      Prior Function Level of Independence: Needs assistance   Gait / Transfers Assistance Needed: walked some with RW, mod. I w/WC transfer and propelling, has a  facility WC.  ADL's / Homemaking Assistance Needed: showered  after set up in shower room        Hand Dominance        Extremity/Trunk Assessment               Lower Extremity Assessment: LLE deficits/detail   LLE Deficits / Details: L aka w/dressing     Communication   Communication: No difficulties  Cognition Arousal/Alertness: Awake/alert Behavior During Therapy: WFL for tasks assessed/performed Overall Cognitive Status: Within Functional Limits for tasks assessed                      General Comments      Exercises        Assessment/Plan    PT Assessment Patient needs continued PT services  PT Diagnosis Acute pain;Other (comment) (  new L AKA)   PT Problem List Decreased strength;Decreased range of motion;Decreased activity tolerance;Decreased mobility;Pain;Decreased knowledge of use of DME  PT Treatment Interventions DME instruction;Gait training;Functional mobility training;Therapeutic activities;Therapeutic exercise;Wheelchair mobility training   PT Goals (Current goals can be found in the Care Plan section) Acute Rehab PT Goals Patient Stated Goal: to go back to rehab PT Goal Formulation: With patient Time For Goal Achievement: 10/04/14 Potential to Achieve Goals: Good    Frequency Min 3X/week   Barriers to discharge        Co-evaluation               End of Session   Activity Tolerance: Patient tolerated treatment well Patient left: in chair;with call  bell/phone within reach Nurse Communication: Mobility status         Time: 0920-0937 PT Time Calculation (min) (ACUTE ONLY): 17 min   Charges:   PT Evaluation $Initial PT Evaluation Tier I: 1 Procedure     PT G CodesRada Hay 09/20/2014, 11:05 AM Blanchard Kelch PT [phone]

## 2014-09-20 NOTE — Progress Notes (Signed)
ANTICOAGULATION CONSULT NOTE- Follow up ANTIBIOTIC CONSULT- Follow up Pharmacy Consult for Heparin  Indication: atrial fibrillation   Pharmacy Consult for Vancomycin  Indication: Left foot wound infection    Patient Measurements: Height: 5' 9.5" (176.5 cm) Weight: 148 lb 9.6 oz (67.405 kg) IBW/kg (Calculated) : 71.85  Vital Signs: Temp: 98.2 F (36.8 C) (05/31 0735) Temp Source: Oral (05/31 0735) BP: 104/52 mmHg (05/31 0850) Pulse Rate: 77 (05/31 0633)  Labs:  Recent Labs  09/18/14 0340 09/19/14 0241 09/20/14 0300  HGB 10.0* 10.2* 10.3*  HCT 29.7* 30.7* 30.8*  PLT 319 329 380  HEPARINUNFRC 0.49 0.50 0.32  CREATININE  --  0.82  --     Estimated Creatinine Clearance: 93.6 mL/min (by C-G formula based on Cr of 0.82).  Assessment: 59 y.o. male presented to ED on 09/15/2014 with afib with RVR. Pharmacy was consulted on 09/15/14 to dose IV heparin infusion. Heparin level remains therapeutic this morning at 0.32 on 1750 units/hr IV heparin. PLTC wnl, H/H low but remains stable today with no reported significant s/s bleeding.   Of note, per cards, patient is not likely a good long term anticoagulation candidate due to ongoing EtOH use.   ID: Vancomycin for L foot wound infection,  s/p L AKA on 09/20/14, afeb, WBC wnl, Renal function stable, SCr 0.82.  5/27 bld x2 - ngtd   Goal of Therapy:  Heparin level 0.3-0.7 units/ml Monitor platelets by anticoagulation protocol: Yes  Vancomycin trough level 10-15 mcg/ml   Plan:  Continue heparin drip at current rate of 1750 units/hr Daily heparin level and CBC  RN discussed with Internal Medicine MD this AM and states MD plans to discontinue IV vancomycin today and change to oral antibiotic. Thus no vancomycin level necessary today.  Noah Delaineuth Clara Herbison, RPh Clinical Pharmacist Pager: [phone] 09/20/2014 10:13 AM

## 2014-09-20 NOTE — Progress Notes (Addendum)
  [facility] Progress Note  09/20/2014 10:45 AM 1 Day Post-Op  Subjective:  Minor pain with left AKA    Filed Vitals:   09/20/14 1032  BP: 88/50  Pulse: 67  Temp:   Resp: 10    Physical Exam: Left AKA dressing clean and dry. Mobilizing stump well.   CBC    Component Value Date/Time   WBC 9.4 09/20/2014 0300   RBC 3.42* 09/20/2014 0300   HGB 10.3* 09/20/2014 0300   HCT 30.8* 09/20/2014 0300   PLT 380 09/20/2014 0300   MCV 90.1 09/20/2014 0300   MCH 30.1 09/20/2014 0300   MCHC 33.4 09/20/2014 0300   RDW 13.1 09/20/2014 0300   LYMPHSABS 1.5 09/15/2014 1000   MONOABS 1.0 09/15/2014 1000   EOSABS 0.1 09/15/2014 1000   BASOSABS 0.0 09/15/2014 1000    BMET    Component Value Date/Time   NA 135 09/19/2014 0241   K 4.4 09/19/2014 0241   CL 101 09/19/2014 0241   CO2 25 09/19/2014 0241   GLUCOSE 85 09/19/2014 0241   BUN 10 09/19/2014 0241   CREATININE 0.82 09/19/2014 0241   CALCIUM 8.9 09/19/2014 0241   GFRNONAA >60 09/19/2014 0241   GFRAA >60 09/19/2014 0241    INR    Component Value Date/Time   INR 1.25 09/15/2014 1000     Intake/Output Summary (Last 24 hours) at 09/20/14 1045 Last data filed at 09/20/14 0813  Gross per 24 hour  Intake 2795.47 ml  Output   2790 ml  Net   5.47 ml     Assessment:  59 y.o. male is s/p: left above-the-knee amputation for left foot gangrene 1 Day Post-Op  Plan: -Left AKA dressing clean and dry. -Hgb stable. -Will take down dressing tomorrow.  -Ok to d/c vancomycin.    Maris BergerKimberly Trinh, PA-C [facility] Office: [phone] Pager: [phone] 09/20/2014 10:45 AM  Addendum  Dressing off tomorrow  Leonides SakeBrian Chen, MD [facility] [place] Office: [phone] Pager: [phone]  09/20/2014, 3:30 PM

## 2014-09-21 DIAGNOSIS — G9340 Encephalopathy, unspecified: Secondary | ICD-10-CM

## 2014-09-21 DIAGNOSIS — I739 Peripheral vascular disease, unspecified: Secondary | ICD-10-CM

## 2014-09-21 DIAGNOSIS — Z89612 Acquired absence of left leg above knee: Secondary | ICD-10-CM

## 2014-09-21 DIAGNOSIS — I10 Essential (primary) hypertension: Secondary | ICD-10-CM

## 2014-09-21 LAB — VITAMIN B12: VITAMIN B 12: 488 pg/mL (ref 180–914)

## 2014-09-21 LAB — BASIC METABOLIC PANEL
Anion gap: 9 (ref 5–15)
BUN: 14 mg/dL (ref 6–20)
CALCIUM: 8.9 mg/dL (ref 8.9–10.3)
CO2: 28 mmol/L (ref 22–32)
Chloride: 100 mmol/L — ABNORMAL LOW (ref 101–111)
Creatinine, Ser: 0.83 mg/dL (ref 0.61–1.24)
GLUCOSE: 79 mg/dL (ref 65–99)
POTASSIUM: 4.4 mmol/L (ref 3.5–5.1)
SODIUM: 137 mmol/L (ref 135–145)

## 2014-09-21 LAB — CBC
HCT: 31.2 % — ABNORMAL LOW (ref 39.0–52.0)
HEMOGLOBIN: 10.2 g/dL — AB (ref 13.0–17.0)
MCH: 29.7 pg (ref 26.0–34.0)
MCHC: 32.7 g/dL (ref 30.0–36.0)
MCV: 90.7 fL (ref 78.0–100.0)
Platelets: 396 10*3/uL (ref 150–400)
RBC: 3.44 MIL/uL — AB (ref 4.22–5.81)
RDW: 13.4 % (ref 11.5–15.5)
WBC: 9.7 10*3/uL (ref 4.0–10.5)

## 2014-09-21 LAB — MAGNESIUM: Magnesium: 1.7 mg/dL (ref 1.7–2.4)

## 2014-09-21 LAB — IRON AND TIBC
Iron: 53 ug/dL (ref 45–182)
Saturation Ratios: 25 % (ref 17.9–39.5)
TIBC: 216 ug/dL — ABNORMAL LOW (ref 250–450)
UIBC: 163 ug/dL

## 2014-09-21 LAB — GLUCOSE, CAPILLARY
GLUCOSE-CAPILLARY: 93 mg/dL (ref 65–99)
Glucose-Capillary: 106 mg/dL — ABNORMAL HIGH (ref 65–99)
Glucose-Capillary: 95 mg/dL (ref 65–99)

## 2014-09-21 LAB — FOLATE: Folate: 19.8 ng/mL (ref >5.9)

## 2014-09-21 LAB — TECHNOLOGIST SMEAR REVIEW

## 2014-09-21 LAB — HEMOGLOBIN A1C
Hgb A1c MFr Bld: 6 % — ABNORMAL HIGH (ref 4.8–5.6)
Mean Plasma Glucose: 126 mg/dL

## 2014-09-21 LAB — RETICULOCYTES
RBC.: 3.44 MIL/uL — ABNORMAL LOW (ref 4.22–5.81)
Retic Count, Absolute: 72.2 10*3/uL (ref 19.0–186.0)
Retic Ct Pct: 2.1 % (ref 0.4–3.1)

## 2014-09-21 LAB — FERRITIN: FERRITIN: 416 ng/mL — AB (ref 24–336)

## 2014-09-21 LAB — HIV ANTIBODY (ROUTINE TESTING W REFLEX): HIV Screen 4th Generation wRfx: NONREACTIVE

## 2014-09-21 MED ORDER — PANTOPRAZOLE SODIUM 40 MG PO TBEC: 40.0000 mg | DELAYED_RELEASE_TABLET | Freq: Every day | ORAL | Status: DC

## 2014-09-21 MED ORDER — AMIODARONE HCL 200 MG PO TABS: 400.0000 mg | ORAL_TABLET | Freq: Every day | ORAL | Status: DC

## 2014-09-21 MED ORDER — METOPROLOL SUCCINATE ER 25 MG PO TB24: 12.5000 mg | ORAL_TABLET | Freq: Every day | ORAL | Status: DC

## 2014-09-21 MED ORDER — ATORVASTATIN CALCIUM 80 MG PO TABS: 80.0000 mg | ORAL_TABLET | Freq: Every day | ORAL | Status: DC

## 2014-09-21 MED ORDER — AMIODARONE HCL 200 MG PO TABS: ORAL_TABLET | ORAL | Status: DC

## 2014-09-21 MED ORDER — HYDROCODONE-ACETAMINOPHEN 5-325 MG PO TABS: 1.0000 | ORAL_TABLET | ORAL | Status: DC | PRN

## 2014-09-21 NOTE — Progress Notes (Signed)
Subjective:  No acute events overnight. Pain is well-controlled. Patient is tolerating diet. Ready to go home.  Objective: Vital signs in last 24 hours: Filed Vitals:   09/21/14 0430 09/21/14 0734 09/21/14 0941 09/21/14 1405  BP: 96/60  104/77   Pulse: 66     Temp: 98 F (36.7 C) 97.6 F (36.4 C)  98.2 F (36.8 C)  TempSrc: Oral Oral  Oral  Resp: 15  11   Height:      Weight:      SpO2: 100%      Weight change:   Intake/Output Summary (Last 24 hours) at 09/21/14 1611 Last data filed at 09/21/14 1300  Gross per 24 hour  Intake    360 ml  Output   2800 ml  Net  -2440 ml   Physical Exam: General: AA male, alert, cooperative, NAD. HEENT: Right eye w/ corneal clouding, left eye PRRL, EOMI. Moist mucus membranes. Neck: Full range of motion without pain, supple, no lymphadenopathy or carotid bruits Lungs: Clear to ascultation bilaterally, normal work of respiration, no wheezes, rales, rhonchi Heart: RRR, no murmurs, gallops, or rubs Abdomen: Soft, non-tender, non-distended, BS + Extremities: Left AKA with dressing in place.  Neurologic: Alert & oriented x3, cranial nerves II-XII intact, strength grossly intact, sensation intact to light touch   Lab Results: Basic Metabolic Panel:  Recent Labs Lab 09/17/14 0350 09/19/14 0241 09/20/14 1337 09/21/14 0611  NA 133* 135  --  137  K 4.0 4.4  --  4.4  CL 103 101  --  100*  CO2 22 25  --  28  GLUCOSE 86 85  --  79  BUN 5* 10  --  14  CREATININE 0.68 0.82 0.87 0.83  CALCIUM 8.2* 8.9  --  8.9  MG 1.7  --   --  1.7  PHOS 3.5  --   --   --    Liver Function Tests:  Recent Labs Lab 09/15/14 0804  AST 22  ALT 16*  ALKPHOS 84  BILITOT 0.9  PROT 7.9  ALBUMIN 3.2*   CBC:  Recent Labs Lab 09/15/14 1000  09/20/14 1337 09/21/14 0611  WBC 12.1*  < > 13.4* 9.7  NEUTROABS 9.5*  --   --   --   HGB 11.1*  < > 9.7* 10.2*  HCT 33.0*  < > 29.7* 31.2*  MCV 90.2  < > 91.1 90.7  PLT 304  < > 368 396  < > = values  in this interval not displayed. Cardiac Enzymes:  Recent Labs Lab 09/15/14 1715 09/15/14 2157 09/16/14 0504  TROPONINI 0.04* <0.03 <0.03   CBG:  Recent Labs Lab 09/20/14 0719 09/20/14 1118 09/20/14 1627 09/20/14 2223 09/21/14 0732 09/21/14 1147  GLUCAP 146* 128* 105* 102* 93 106*   Fasting Lipid Panel:  Recent Labs Lab 09/15/14 0804  CHOL 120  HDL 28*  LDLCALC 77  TRIG 75  CHOLHDL 4.3   Thyroid Function Tests:  Recent Labs Lab 09/16/14 0504  TSH 0.781   Coagulation:  Recent Labs Lab 09/15/14 1000  LABPROT 15.8*  INR 1.25   Micro Results: Recent Results (from the past 240 hour(s))  Culture, blood (routine x 2)     Status: None (Preliminary result)   Collection Time: 09/16/14 10:29 AM  Result Value Ref Range Status   Specimen Description BLOOD RIGHT HAND  Final   Special Requests BOTTLES DRAWN AEROBIC ONLY  [facility]  Final   Culture   Final  BLOOD CULTURE RECEIVED NO GROWTH TO DATE CULTURE WILL BE HELD FOR 5 DAYS BEFORE ISSUING A FINAL NEGATIVE REPORT Performed at [facility]    Report Status PENDING  Incomplete  Culture, blood (routine x 2)     Status: None (Preliminary result)   Collection Time: 09/16/14 10:37 AM  Result Value Ref Range Status   Specimen Description BLOOD LEFT HAND  Final   Special Requests BOTTLES DRAWN AEROBIC ONLY  [facility]  Final   Culture   Final           BLOOD CULTURE RECEIVED NO GROWTH TO DATE CULTURE WILL BE HELD FOR 5 DAYS BEFORE ISSUING A FINAL NEGATIVE REPORT Performed at [facility]    Report Status PENDING  Incomplete  Surgical pcr screen     Status: Abnormal   Collection Time: 09/18/14  8:29 PM  Result Value Ref Range Status   MRSA, PCR POSITIVE (A) NEGATIVE Final    Comment: RESULT CALLED TO, READ BACK BY AND VERIFIED WITH: J.TSOUTIS,RN 2327 09/18/14 M.CAMPBELL    Staphylococcus aureus POSITIVE (A) NEGATIVE Final    Comment:        The Xpert SA Assay (FDA approved for NASAL specimens in  patients over 58 years of age), is one component of a comprehensive surveillance program.  Test performance has been validated by [facility] for patients greater than or equal to 58 year old. It is not intended to diagnose infection nor to guide or monitor treatment.    Studies/Results: No results found. Medications: I have reviewed the patient's current medications. Scheduled Meds: . acetaminophen  650 mg Rectal Once  . [START ON 09/22/2014] amiodarone  400 mg Oral Daily  . aspirin  81 mg Oral Daily  . atorvastatin  80 mg Oral q1800  . Chlorhexidine Gluconate Cloth  6 each Topical Q0600  . docusate sodium  100 mg Oral Daily  . heparin  5,000 Units Subcutaneous 3 times per day  . insulin aspart  0-5 Units Subcutaneous QHS  . insulin aspart  0-9 Units Subcutaneous TID WC  . [START ON 09/22/2014] metoprolol succinate  12.5 mg Oral Daily  . mupirocin ointment  1 application Nasal BID  . pantoprazole  40 mg Oral Daily  . sodium chloride  3 mL Intravenous Q12H   Continuous Infusions: . sodium chloride Stopped (09/20/14 0555)   PRN Meds:.acetaminophen **OR** acetaminophen, alum & mag hydroxide-simeth, guaiFENesin-dextromethorphan, hydrALAZINE, HYDROcodone-acetaminophen, labetalol, metoprolol, morphine injection, phenol   Assessment/Plan: 59 y/o M w/ PMHx of DM type II, anemia, right eye blindness, h/o alcohol abuse, PVD, and chronic left foot wound, admitted for atrial fibrillation w/ RVR.   Atrial fibrillation with RVR: Remains in sinus. No symptoms.  -Cardiology following, appreciate recommendations -Toprol-XL reduced to 12.5 mg daily for hypertension. -Amiodarone 400 mg daily for 3 days (ending on 09/23/2014). Amiodarone 200 mg daily afterwards -Heparin gtt discontinued as per cardiology note 09/16/2014: he will not be a good long-term candidate for anticoagulation. -Telemetry  PVD w/ Left Foot Gangrene: Blood cultures w/ no growth to date. Status post left AKA on 09/19/2014.  Evaluated by PT/OT: Recommend return to SNF. -Vascular surgery following, appreciate recommendations -Continue ASA 81mg  daily  -Lipitor 80 mg qhs -Vicodin prn for pain -Benadryl prn for mild anxiety  Acute encephalopathy: Seems to be at his baseline.  -Continue to monitor  Chronic anemia: Stable.   DM Type II: No medications for DM. Hemoglobin A1c of 6.0. -ISS  DVT/PE PPx:: Subcutaneous Heparin   Dispo:  Today.  The patient does have a current PCP (No primary care provider on file.) and does not need an [facility] hospital follow-up appointment after discharge.  The patient does not have transportation limitations that hinder transportation to clinic appointments.  .Services Needed at time of discharge: Y = Yes, Blank = No PT:   OT:   RN:   Equipment:   Other:     LOS: 6 days   Harold Barban, MD 09/21/2014, 4:11 PM

## 2014-09-21 NOTE — Progress Notes (Signed)
   Daily Progress Note  Assessment/Planning: POD #2 s/p L AKA   Viable stump  Staples out in 4 weeks  ABD to staple line with stump sock  Looks like PT/OT recommending rehab at SNF   Subjective  - 2 Days Post-Op  Pain ok  Objective Filed Vitals:   09/20/14 2000 09/21/14 0000 09/21/14 0430 09/21/14 0734  BP: 95/56 110/61 96/60   Pulse: 64 70 66   Temp: 98.4 F (36.9 C) 97.5 F (36.4 C) 98 F (36.7 C) 97.6 F (36.4 C)  TempSrc: Oral Oral Oral Oral  Resp: 10 17 15    Height:      Weight:      SpO2: 100% 99% 100%     Intake/Output Summary (Last 24 hours) at 09/21/14 0809 Last data filed at 09/21/14 0751  Gross per 24 hour  Intake    600 ml  Output   2950 ml  Net  -2350 ml    VASC  L AKA incision c/d/i, staples in place, viable stump  Laboratory CBC    Component Value Date/Time   WBC 9.7 09/21/2014 0611   HGB 10.2* 09/21/2014 0611   HCT 31.2* 09/21/2014 0611   PLT 396 09/21/2014 0611    BMET    Component Value Date/Time   NA 137 09/21/2014 0611   K 4.4 09/21/2014 0611   CL 100* 09/21/2014 0611   CO2 28 09/21/2014 0611   GLUCOSE 79 09/21/2014 0611   BUN 14 09/21/2014 0611   CREATININE 0.83 09/21/2014 0611   CALCIUM 8.9 09/21/2014 0611   GFRNONAA >60 09/21/2014 0611   GFRAA >60 09/21/2014 [id number]    Leonides SakeBrian Ammaar Encina, MD [facility] Office: [phone] Pager: [phone]  09/21/2014, 8:09 AM

## 2014-09-21 NOTE — Discharge Instructions (Signed)
Please continue taking your medications as directed. Please follow-up with vascular surgery for a wound check. Please follow-up with cardiology for your atrial fibrillation.    Atrial Fibrillation Atrial fibrillation is a type of irregular heart rhythm (arrhythmia). During atrial fibrillation, the upper chambers of the heart (atria) quiver continuously in a chaotic pattern. This causes an irregular and often rapid heart rate.  Atrial fibrillation is the result of the heart becoming overloaded with disorganized signals that tell it to beat. These signals are normally released one at a time by a part of the right atrium called the sinoatrial node. They then travel from the atria to the lower chambers of the heart (ventricles), causing the atria and ventricles to contract and pump blood as they pass. In atrial fibrillation, parts of the atria outside of the sinoatrial node also release these signals. This results in two problems. First, the atria receive so many signals that they do not have time to fully contract. Second, the ventricles, which can only receive one signal at a time, beat irregularly and out of rhythm with the atria.  There are three types of atrial fibrillation:   Paroxysmal. Paroxysmal atrial fibrillation starts suddenly and stops on its own within a week.  Persistent. Persistent atrial fibrillation lasts for more than a week. It may stop on its own or with treatment.  Permanent. Permanent atrial fibrillation does not go away. Episodes of atrial fibrillation may lead to permanent atrial fibrillation. Atrial fibrillation can prevent your heart from pumping blood normally. It increases your risk of stroke and can lead to heart failure.  CAUSES   Heart conditions, including a heart attack, heart failure, coronary artery disease, and heart valve conditions.   Inflammation of the sac that surrounds the heart (pericarditis).  Blockage of an artery in the lungs (pulmonary  embolism).  Pneumonia or other infections.  Chronic lung disease.  Thyroid problems, especially if the thyroid is overactive (hyperthyroidism).  Caffeine, excessive alcohol use, and use of some illegal drugs.   Use of some medicines, including certain decongestants and diet pills.  Heart surgery.   Birth defects.  Sometimes, no cause can be found. When this happens, the atrial fibrillation is called lone atrial fibrillation. The risk of complications from atrial fibrillation increases if you have lone atrial fibrillation and you are age 58 years or older. RISK FACTORS  Heart failure.  Coronary artery disease.  Diabetes mellitus.   High blood pressure (hypertension).   Obesity.   Other arrhythmias.   Increased age. SIGNS AND SYMPTOMS   A feeling that your heart is beating rapidly or irregularly.   A feeling of discomfort or pain in your chest.   Shortness of breath.   Sudden light-headedness or weakness.   Getting tired easily when exercising.   Urinating more often than normal (mainly when atrial fibrillation first begins).  In paroxysmal atrial fibrillation, symptoms may start and suddenly stop. DIAGNOSIS  Your health care provider may be able to detect atrial fibrillation when taking your pulse. Your health care provider may have you take a test called an ambulatory electrocardiogram (ECG). An ECG records your heartbeat patterns over a 24-hour period. You may also have other tests, such as:  Transthoracic echocardiogram (TTE). During echocardiography, sound waves are used to evaluate how blood flows through your heart.  Transesophageal echocardiogram (TEE).  Stress test. There is more than one type of stress test. If a stress test is needed, ask your health care provider about which type is best  for you.  Chest X-ray exam.  Blood tests.  Computed tomography (CT). TREATMENT  Treatment may include:  Treating any underlying conditions. For  example, if you have an overactive thyroid, treating the condition may correct atrial fibrillation.  Taking medicine. Medicines may be given to control a rapid heart rate or to prevent blood clots, heart failure, or a stroke.  Having a procedure to correct the rhythm of the heart:  Electrical cardioversion. During electrical cardioversion, a controlled, low-energy shock is delivered to the heart through your skin. If you have chest pain, very low blood pressure, or sudden heart failure, this procedure may need to be done as an emergency.  Catheter ablation. During this procedure, heart tissues that send the signals that cause atrial fibrillation are destroyed.  Surgical ablation. During this surgery, thin lines of heart tissue that carry the abnormal signals are destroyed. This procedure can either be an open-heart surgery or a minimally invasive surgery. With the minimally invasive surgery, small cuts are made to access the heart instead of a large opening.  Pulmonary venous isolation. During this surgery, tissue around the veins that carry blood from the lungs (pulmonary veins) is destroyed. This tissue is thought to carry the abnormal signals. HOME CARE INSTRUCTIONS   Take medicines only as directed by your health care provider. Some medicines can make atrial fibrillation worse or recur.  If blood thinners were prescribed by your health care provider, take them exactly as directed. Too much blood-thinning medicine can cause bleeding. If you take too little, you will not have the needed protection against stroke and other problems.  Perform blood tests at home if directed by your health care provider. Perform blood tests exactly as directed.  Quit smoking if you smoke.  Do not drink alcohol.  Do not drink caffeinated beverages such as coffee, soda, and some teas. You may drink decaffeinated coffee, soda, or tea.   Maintain a healthy weight.Do not use diet pills unless your health care  provider approves. They may make heart problems worse.   Follow diet instructions as directed by your health care provider.  Exercise regularly as directed by your health care provider.  Keep all follow-up visits as directed by your health care provider. This is important. PREVENTION  The following substances can cause atrial fibrillation to recur:   Caffeinated beverages.  Alcohol.  Certain medicines, especially those used for breathing problems.  Certain herbs and herbal medicines, such as those containing ephedra or ginseng.  Illegal drugs, such as cocaine and amphetamines. Sometimes medicines are given to prevent atrial fibrillation from recurring. Proper treatment of any underlying condition is also important in helping prevent recurrence.  SEEK MEDICAL CARE IF:  You notice a change in the rate, rhythm, or strength of your heartbeat.  You suddenly begin urinating more frequently.  You tire more easily when exerting yourself or exercising. SEEK IMMEDIATE MEDICAL CARE IF:   You have chest pain, abdominal pain, sweating, or weakness.  You feel nauseous.  You have shortness of breath.  You suddenly have swollen feet and ankles.  You feel dizzy.  Your face or limbs feel numb or weak.  You have a change in your vision or speech. MAKE SURE YOU:   Understand these instructions.  Will watch your condition.  Will get help right away if you are not doing well or get worse. Document Released: 04/08/2005 Document Revised: 08/23/2013 Document Reviewed: 05/19/2012 [facility] Patient Information 2015 [place], [place]. This information is not intended to replace advice  given to you by your health care provider. Make sure you discuss any questions you have with your health care provider.

## 2014-09-21 NOTE — Progress Notes (Signed)
CSW printed new FL2 for MD's signature, completed packet and [facility].  RN to call PTAR after FL2 is signed.

## 2014-09-21 NOTE — Progress Notes (Signed)
Orthopedic Tech Progress Note Patient Details:  Greg Davis [birth year] [id number]  Patient ID: Greg Davis, male   DOB: [birth year], 59 y.o.   MRN: [id number] Called in bio-tech brace order; spoke with Richardean Chimeraathy  Greg Davis 09/21/2014, 11:31 AM

## 2014-09-21 NOTE — Progress Notes (Signed)
Patient Name: Greg Davis Date of Encounter: 09/21/2014  Active Problems:   Atrial fibrillation with RVR   Acute encephalopathy   Left foot infection   PVD (peripheral vascular disease)   RUQ pain  SUBJECTIVE  Denies chest pain, SOB or palpitation. Complaining of left knee pain.   CURRENT MEDS . acetaminophen  650 mg Rectal Once  . amiodarone  400 mg Oral BID  . aspirin  81 mg Oral Daily  . atorvastatin  80 mg Oral q1800  . Chlorhexidine Gluconate Cloth  6 each Topical Q0600  . docusate sodium  100 mg Oral Daily  . heparin  5,000 Units Subcutaneous 3 times per day  . insulin aspart  0-5 Units Subcutaneous QHS  . insulin aspart  0-9 Units Subcutaneous TID WC  . metoprolol succinate  25 mg Oral Daily  . mupirocin ointment  1 application Nasal BID  . pantoprazole  40 mg Oral Daily  . sodium chloride  3 mL Intravenous Q12H   OBJECTIVE  Filed Vitals:   09/20/14 2000 09/21/14 0000 09/21/14 0430 09/21/14 0734  BP: 95/56 110/61 96/60   Pulse: 64 70 66   Temp: 98.4 F (36.9 C) 97.5 F (36.4 C) 98 F (36.7 C) 97.6 F (36.4 C)  TempSrc: Oral Oral Oral Oral  Resp: Height:      Weight:      SpO2: 100% 99% 100%     Intake/Output Summary (Last 24 hours) at 09/21/14 0820 Last data filed at 09/21/14 0751  Gross per 24 hour  Intake    360 ml  Output   2950 ml  Net  -2590 ml   Filed Weights   09/17/14 0500 09/18/14 0640 09/19/14 0558  Weight: 147 lb (66.679 kg) 147 lb 9.6 oz (66.951 kg) 148 lb 9.6 oz (67.405 kg)    PHYSICAL EXAM  General: Pleasant, NAD. Neuro: Alert and oriented X 3. Moves all extremities spontaneously. Psych: Normal affect. HEENT:  Normal. Blind in left eye  Neck: Supple without bruits or JVD. Lungs:  Resp regular and unlabored, CTA. Heart: RRR no s3, s4, or murmurs. Abdomen: Soft, non-tender, non-distended, BS + x 4.  Extremities: No clubbing, cyanosis or edema. DR Radials 2+ right LE. LLE AKA amputation with dressing in place.    Accessory Clinical Findings  CBC  Recent Labs  09/20/14 1337 09/21/14 0611  WBC 13.4* 9.7  HGB 9.7* 10.2*  HCT 29.7* 31.2*  MCV 91.1 90.7  PLT 368 396   Basic Metabolic Panel  Recent Labs  09/19/14 0241 09/20/14 1337 09/21/14 0611  NA 135  --  137  K 4.4  --  4.4  CL 101  --  100*  CO2 25  --  28  GLUCOSE 85  --  79  BUN 10  --  14  CREATININE 0.82 0.87 0.83  CALCIUM 8.9  --  8.9  MG  --   --  1.7    TELE  NSR with rate of 60-70s..    2D echo 09/16/14 Study Conclusions  - Left ventricle: The cavity size was normal. Wall thickness was normal. Systolic function was normal. The estimated ejection fraction was in the range of 55% to 60%. Wall motion was normal; there were no regional wall motion abnormalities. Left ventricular diastolic function parameters were normal for the patient&'s age. - Aortic valve: Mildly calcified annulus. Trileaflet. - Mitral valve: Mildly thickened leaflets . There was mild regurgitation. - Right atrium: The atrium was  at the upper limits of normal in size. Central venous pressure (est): 3 mm Hg. - Tricuspid valve: There was mild-moderate regurgitation. - Pulmonary arteries: Systolic pressure was mildly increased. PA peak pressure: 40 mm Hg (S). - Pericardium, extracardiac: There was no pericardial effusion.  Impressions:  - Normal LV wall thickness with LVEF 55-60%. Normal diastolic function. Mildly thickened mitral leaflets with mild mitral regurgitation. Upper normal right atrial size. Mild to moderate tricuspid regurgitation with PASP 40 mmHg.   ASSESSMENT AND PLAN 59 y/o M w/ PMHx of DM type II, anemia, right eye blindness, h/o alcohol abuse, PVD, and chronic left foot wound, admitted 5/26 for atrial fibrillation w/ RVR and acute encephalopathy.   1. Afib with RVR - Converted spontaneously to sinus rhythm 5/27. On Amio 400mg  BID, we will decrease to 400 mg po daily x 3 days, then 200 mg po daily  chronically. We will decrease the dose of Toprol XL 25mg . Maintaining sinus rhtym.  - CHADSVASc score is at least 2 (DM and vascular disease). Of note of 5/27, Dr. Jens Somrenshaw, he is not a good candidate for long term anticoagulation given alcohol abuse. MD to advice further.  - Continue ASA and heparin [place].   2. PAD w/ left foot gangrane - Left AKA done yesterday - Continue Lipitor 80mg .  - d/c  Vancomycin yesterday - Vicodin prn for pain  3. DM type II - On ISS - HgbA1c 6.0  4.  Chronic Anemia - Stable  - Management per primary  5. Acute encephalopathy. - Improved.   6. Hypotension - Pt hasn't received his Toprol xl yesterday due to low pressure. Will recheck BP in both arm. Nurse reported BP 104/77 in another arm. I think his low BP is likely due to pain medications. Eating and drinking ok. Had negative 2.4 L I/O yesterday. Consider IV fluid if continues to remain low.  Might need to cut back Toprol XL to 12.5mg . Currently rates in 70s. MD to advice.    Signed, Manson PasseyBhagat,Bhavinkumar PA-C Pager [phone]   The patient was seen, examined and discussed with Bhagat,Bhavinkumar PA-C and I agree with the above.    59 year old, etoh abuse, new a-fib with RVR, converted spontaneously to sinus rhythm 5/27,  On Amio 400mg  BID, we will decrease to 400 mg po daily x 3 days, then 200 mg po daily chronically. We will decrease the dose of Toprol XL to 12.5mg  daily. Maintaining sinus rhtym.  - CHADSVASc score is at least 2 (DM and vascular disease), he is not a good candidate for long term anticoagulation given alcohol abuse. He has preserved LVEF and normal left atrial size. This episode might be a result of an acute illness.  He is post AKA for gangrene yesterday. We will follow.   Lars MassonELSON, Brode Sculley H 09/21/2014

## 2014-09-21 NOTE — Discharge Summary (Signed)
Name: Greg Davis MRN: [id number] DOB: 07/06/1955 59 y.o. PCP: No primary care provider on file.  Date of Admission: 09/15/2014  9:27 AM Date of Discharge: 09/21/2014 Attending Physician: Earl Lagos, MD  Discharge Diagnosis:  Principal Problem:   Above knee amputation of left lower extremity Active Problems:   Atrial fibrillation with RVR   PVD (peripheral vascular disease)   Prediabetes   Anemia  Discharge Medications:   Medication List    ASK your doctor about these medications        ARTIFICIAL TEAR OINTMENT OP  Apply to eye 3 (three) times daily.     aspirin 81 MG tablet  Take 81 mg by mouth daily.     diltiazem 240 MG 24 hr capsule  Commonly known as:  TIAZAC  Take 240 mg by mouth daily.     famotidine 20 MG tablet  Commonly known as:  PEPCID  Take 20 mg by mouth 2 (two) times daily.     folic acid 1 MG tablet  Commonly known as:  FOLVITE  Take 1 mg by mouth daily.     metoprolol succinate 25 MG 24 hr tablet  Commonly known as:  TOPROL-XL  Take 25 mg by mouth daily. Take 1/2 tablet bid     Oxycodone HCl 10 MG Tabs  Take 10 mg by mouth daily. 10 mg daily prior to wound care  Per medication record from nursing home     oxyCODONE 5 MG immediate release tablet  Commonly known as:  Oxy IR/ROXICODONE  Take 5 mg by mouth every 4 (four) hours as needed for moderate pain or severe pain.     polyethylene glycol packet  Commonly known as:  MIRALAX / GLYCOLAX  Take 17 g by mouth daily.     pregabalin 50 MG capsule  Commonly known as:  LYRICA  Take 50 mg by mouth 2 (two) times daily.     senna 8.6 MG Tabs tablet  Commonly known as:  SENOKOT  Take 2 tablets by mouth at bedtime.     thiamine 100 MG tablet  Commonly known as:  VITAMIN B-1  Take 100 mg by mouth daily.     traZODone 100 MG tablet  Commonly known as:  DESYREL  Take 100 mg by mouth at bedtime.        Disposition and follow-up:   Greg Davis was discharged from [facility] in Stable condition.  At the hospital follow up visit please address:  Above knee amputation of left lower extremity and PVD (peripheral vascular disease): Would ensure proper healing of stump site.  Atrial fibrillation with RVR:  Ensure compliance with following regimen: Amiodarone for 3 days (09/21/2014 to 09/23/2014) at 400 mg daily and then 200 mg daily chronically after that. Patient was discharged on metoprolol 12.5 mg daily.  Hypertension:  Would ensure proper blood pressure control at follow-up visit, given recent change in antihypertensives regimen.  2.  Labs / imaging needed at time of follow-up: none  3.  Pending labs/ test needing follow-up: none  Follow-up Appointments: Follow-up Information    Follow up with Leonides Sake, MD In 4 weeks.   Specialties:  Vascular Surgery, Cardiology   Why:  Office will call you to arrange your appt (sent)   Contact information:   [address] [place] [address] [phone]       Schedule an appointment as soon as possible for a visit with [facility].  Contact information:   [address] [place] [phone]      Discharge Instructions:   Consultations: Treatment Team:  Cassell Clementhomas Brackbill, MD  Procedures Performed:  Ct Head Wo Contrast  09/15/2014   CLINICAL DATA:  Confusion, acute encephalopathy  EXAM: CT HEAD WITHOUT CONTRAST  TECHNIQUE: Contiguous axial images were obtained from the base of the skull through the vertex without intravenous contrast.  COMPARISON:  05/27/2013  FINDINGS: No skull fracture are noted. Stable lytic and sclerotic calvarial foci. 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 No intracranial hemorrhage, mass effect or midline shift. Ventricular size is stable from prior exam. No acute cortical infarction. Atherosclerotic calcifications of carotid siphon again noted. No mass lesion is noted on  this unenhanced scan.  IMPRESSION: No acute intracranial abnormality. Stable mild cerebral atrophy. Stable chronic findings as described   Electronically Signed   By: Natasha MeadLiviu  Pop M.D.   On: 09/15/2014 16:04   Dg Chest Port 1 View  09/15/2014   CLINICAL DATA:  Tachycardia.  EXAM: PORTABLE CHEST - 1 VIEW  COMPARISON:  November 19, 2013.  FINDINGS: The heart size and mediastinal contours are within normal limits. Both lungs are clear. No pneumothorax or pleural effusion is noted. Old left-sided rib fractures are noted. Degenerative change of left glenohumeral joint is noted.  IMPRESSION: No acute cardiopulmonary abnormality seen.   Electronically Signed   By: Lupita RaiderJames  Green Jr, M.D.   On: 09/15/2014 11:25   Dg Abd Portable 1v  09/16/2014   CLINICAL DATA:  Left upper quadrant abdominal pain.  EXAM: PORTABLE ABDOMEN - 1 VIEW  COMPARISON:  None.  FINDINGS: Paucity of bowel gas without definite evidence of obstruction.  Nondiagnostic evaluation for pneumoperitoneum secondary supine positioning exclusion a lower thorax. No definite pneumatosis or portal venous gas.  Ill-defined calcifications overlying the right upper abdominal quadrant may represent gallstones.  Vascular calcifications overlie the lower pelvis bilaterally.  No acute osseus abnormalities.  IMPRESSION: 1. Paucity of bowel gas without evidence of obstruction. 2. Ill-defined calcifications overlying the right upper quadrant may represent gallstones. Further evaluation with right upper quadrant abdominal ultrasound could be performed as clinically indicated   Electronically Signed   By: Simonne ComeJohn  Watts M.D.   On: 09/16/2014 01:45   [place] Abdomen Limited Ruq  09/16/2014   CLINICAL DATA:  Right upper quadrant pain for 3 days.  EXAM: US ABDOMEN LIMITED - RIGHT UPPER QUADRANT  COMPARISON:  None.  FINDINGS: Gallbladder:  Numerous small gallstones fill the gallbladder. These measure 10 mm or less in size. No wall thickening. Negative sonographic Murphy's.  Common bile  duct:  Diameter: Normal caliber, 4-5 mm.  Liver:  No focal lesion identified. Within normal limits in parenchymal echogenicity.  IMPRESSION: Cholelithiasis.  No sonographic evidence of acute cholecystitis.   Electronically Signed   By: Charlett NoseKevin  Dover M.D.   On: 09/16/2014 11:58    2D Echo:   Study Conclusions  - Left ventricle: The cavity size was normal. Wall thickness was normal. Systolic function was normal. The estimated ejection fraction was in the range of 55% to 60%. Wall motion was normal; there were no regional wall motion abnormalities. Left ventricular diastolic function parameters were normal for the patient&'s age. - Aortic valve: Mildly calcified annulus. Trileaflet. - Mitral valve: Mildly thickened leaflets . There was mild regurgitation. - Right atrium: The atrium was at the upper limits of normal in size. Central venous pressure (est): 3 mm Hg. - Tricuspid valve:  There was mild-moderate regurgitation. - Pulmonary arteries: Systolic pressure was mildly increased. PA peak pressure: 40 mm Hg (S). - Pericardium, extracardiac: There was no pericardial effusion.  Impressions:  - Normal LV wall thickness with LVEF 55-60%. Normal diastolic function. Mildly thickened mitral leaflets with mild mitral regurgitation. Upper normal right atrial size. Mild to moderate tricuspid regurgitation with PASP 40 mmHg.  Admission HPI:   Greg Davis is a 59 year old man with history of T2DM, cirrhosis, alcohol dependence, L eye blindness presenting with a fib with RVR. He was evaluated by vascular surgery yesterday (Dr. Leonides Sake) for LLE critical limb ischemia with left foot gangrene. He presented for aortogram and upon being placed on the angiography table and he became tachycardic and tachypneic. He was found to be in a fib with RVR. Per ED note, he had denied history of prior arrhythmias.  In the ED, his heart rate went as high as 176, and he was normotensive.  He was given 15mg  IV diltiazem, started on a diltiazem gtt, given 1.5L NS bolus, 25mg  metoprolol succinate, 1mg  IV ativan. He was started on a heparin gtt.  Poor historian presently - mumbling and tangential. He reported he had some nonradiating midsternal chest pain earlier. He denies fevers, chills, cough, shortness of breath, chest pain, palpitations, nausea/vomiting, abdominal pain currently.  Hospital Course by problem list: Principal Problem:   Above knee amputation of left lower extremity Active Problems:   Atrial fibrillation with RVR   PVD (peripheral vascular disease)   Prediabetes   Anemia   Above knee amputation of left lower extremity and PVD (peripheral vascular disease): 3 months of left foot pain with ischemic left toes. Undergoing evaluation by vascular surgery. He was continued on aspirin 81 mg daily. Leukocytosis of 12.1 but afebrile. On hospital day one he became hypotensive with response to fluid bolus. He was started on IV vancomycin. He underwent angiogram on 09/16/2014. He is noted to have extensive left tibial disease, no evidence of PFA collaterals, despite having a chronic SFA occlusion suggesting the presence of distal PFA disease. He proceeded to the operating room for a left AKA on 09/19/2014. His IV vancomycin was discontinued. He was evaluated by PT/OT, recommending SNF. Would ensure proper healing of stump site.  Atrial fibrillation with RVR: Repeat EKG on admission with sinus rhythm. Chest XR with no acute abnormality. Continued on home diltiazem 240mg  24hr daily, metoprolol succinate 12.5mg  BID. Cardiology was consulted. He was started on heparin drip and diltiazem drip. That evening he converted back to atrial fibrillation with RVR and blood pressure 80s/60s. He was given normal saline bolus with response to his blood pressure. He was also discontinued on the diltiazem and started on amiodarone drip. Per records from OSH, history of paroxysmal atrial fibrillation,  ischemic cardiomyopathy with ejection fraction of 30-35%. On 09/16/2014, he converted back to sinus rhythm. ECHO from 09/16/2014 shows normal EF (55-60%) and normal diastolic function. PAP 40 mmHg, some mild valve thickening. Enlarged RA. His amiodarone drip was changed to amiodarone by mouth. Amiodarone for 3 days (09/21/2014 to 09/23/2014) at 400 mg daily and then 200 mg daily chronically after that. Patient was discharged on metoprolol 12.5 mg daily. He was started on Lipitor 80 mg daily at bedtime.  Acute encephalopathy: Ativan given at 10:45AM. Cardiology evaluated at 11:30AM and noted mental status changes. He was started on heparin gtt at 12:45AM. He had UE rigidity and hyperreflexia. CT head without contrast with no acute intracranial abnormality. This resolved the following morning.  Likely secondary to Ativan.  Prediabetes: Hemoglobin A1c 6%. Not on any medications.  Anemia: Chronic 2/2 alcohol with Hgb on admission 11.1 with MCV wnl. Vitamin B12 was normal at 488. Reticulocytes unremarkable. Iron normal at 53. TIBC low at 216. Ferritin high at 416. His hemoglobin remained stable throughout his hospital course.  Hypertension: Patient's home labetalol and hydralazine were held in the setting of hypotension in the setting of atrial fibrillation with RVR. Patient was continued on amiodarone and metoprolol as described above. Would ensure proper blood pressure control at follow-up visit, given recent change in antihypertensives regimen.  Discharge Vitals:   BP 104/77 mmHg  Pulse 66  Temp(Src) 98.2 F (36.8 C) (Oral)  Resp 11  Ht 5' 9.5" (1.765 m)  Wt 148 lb 9.6 oz (67.405 kg)  BMI 21.64 kg/m2  SpO2 100%  Discharge Labs:  Results for orders placed or performed during the hospital encounter of 09/15/14 (from the past 24 hour(s))  Glucose, capillary     Status: Abnormal   Collection Time: 09/20/14  4:27 PM  Result Value Ref Range   Glucose-Capillary 105 (H) 65 - 99 mg/dL  Glucose,  capillary     Status: Abnormal   Collection Time: 09/20/14 10:23 PM  Result Value Ref Range   Glucose-Capillary 102 (H) 65 - 99 mg/dL  Basic metabolic panel     Status: Abnormal   Collection Time: 09/21/14  6:11 AM  Result Value Ref Range   Sodium 137 135 - 145 mmol/L   Potassium 4.4 3.5 - 5.1 mmol/L   Chloride 100 (L) 101 - 111 mmol/L   CO2 28 22 - 32 mmol/L   Glucose, Bld 79 65 - 99 mg/dL   BUN 14 6 - 20 mg/dL   Creatinine, Ser 1.61 0.61 - 1.24 mg/dL   Calcium 8.9 8.9 - 09.6 mg/dL   GFR calc non Af Amer >60 >60 mL/min   GFR calc Af Amer >60 >60 mL/min   Anion gap 9 5 - 15  CBC     Status: Abnormal   Collection Time: 09/21/14  6:11 AM  Result Value Ref Range   WBC 9.7 4.0 - 10.5 K/uL   RBC 3.44 (L) 4.22 - 5.81 MIL/uL   Hemoglobin 10.2 (L) 13.0 - 17.0 g/dL   HCT 04.5 (L) 40.9 - 81.1 %   MCV 90.7 78.0 - 100.0 fL   MCH 29.7 26.0 - 34.0 pg   MCHC 32.7 30.0 - 36.0 g/dL   RDW 91.4 78.2 - 95.6 %   Platelets 396 150 - 400 K/uL  Vitamin B12     Status: None   Collection Time: 09/21/14  6:11 AM  Result Value Ref Range   Vitamin B-12 488 180 - 914 pg/mL  Folate     Status: None   Collection Time: 09/21/14  6:11 AM  Result Value Ref Range   Folate 19.8 >5.9 ng/mL  Iron and TIBC     Status: Abnormal   Collection Time: 09/21/14  6:11 AM  Result Value Ref Range   Iron 53 45 - 182 ug/dL   TIBC 213 (L) 086 - 578 ug/dL   Saturation Ratios 25 17.9 - 39.5 %   UIBC 163 ug/dL  Ferritin     Status: Abnormal   Collection Time: 09/21/14  6:11 AM  Result Value Ref Range   Ferritin 416 (H) 24 - 336 ng/mL  Reticulocytes     Status: Abnormal   Collection Time: 09/21/14  6:11 AM  Result Value  Ref Range   Retic Ct Pct 2.1 0.4 - 3.1 %   RBC. 3.44 (L) 4.22 - 5.81 MIL/uL   Retic Count, Manual 72.2 19.0 - 186.0 K/uL  Technologist smear review     Status: None   Collection Time: 09/21/14  6:11 AM  Result Value Ref Range   Tech Review MORPHOLOGY UNREMARKABLE   Magnesium     Status: None    Collection Time: 09/21/14  6:11 AM  Result Value Ref Range   Magnesium 1.7 1.7 - 2.4 mg/dL  Glucose, capillary     Status: None   Collection Time: 09/21/14  7:32 AM  Result Value Ref Range   Glucose-Capillary 93 65 - 99 mg/dL  Glucose, capillary     Status: Abnormal   Collection Time: 09/21/14 11:47 AM  Result Value Ref Range   Glucose-Capillary 106 (H) 65 - 99 mg/dL    Signed: Harold Barban, MD 09/21/2014, 2:39 PM    Services Ordered on Discharge: none Equipment Ordered on Discharge: patient already has wheelchair

## 2014-09-22 ENCOUNTER — Telehealth: Payer: Self-pay | Admitting: Vascular Surgery

## 2014-09-22 LAB — HEPATITIS C ANTIBODY (REFLEX)
HCV Ab: <0.1 s/co ratio (ref 0.0–0.9)
HCV Ab: NEGATIVE

## 2014-09-22 LAB — CULTURE, BLOOD (ROUTINE X 2)
CULTURE: NO GROWTH
Culture: NO GROWTH

## 2014-09-22 LAB — HCV COMMENT:

## 2014-09-22 NOTE — Telephone Encounter (Signed)
-----   Message from Sharee PimpleMarilyn K McChesney, RN sent at 09/20/2014 11:29 AM EDT ----- Regarding: Schedule    ----- Message -----    From: Dara LordsSamantha J Rhyne, PA-C    Sent: 09/19/2014   3:47 PM      To: Vvs Charge Pool  S/p left AKA 09/19/14.  F/u with Dr. Imogene Burnhen in 4 weeks.  Thanks, Lelon MastSamantha

## 2014-09-22 NOTE — Telephone Encounter (Signed)
Pt's vm did not indicate his name- LM for Greg Davis to call Greg Davis at [phone]

## 2014-09-23 DIAGNOSIS — I70262 Atherosclerosis of native arteries of extremities with gangrene, left leg: Secondary | ICD-10-CM

## 2014-10-11 ENCOUNTER — Ambulatory Visit (INDEPENDENT_AMBULATORY_CARE_PROVIDER_SITE_OTHER): Payer: Medicaid Other | Admitting: Physician Assistant

## 2014-10-11 ENCOUNTER — Encounter: Payer: Self-pay | Admitting: Physician Assistant

## 2014-10-11 VITALS — BP 100/70 | HR 61 | Ht 69.5 in | Wt 142.0 lb

## 2014-10-11 DIAGNOSIS — I4891 Unspecified atrial fibrillation: Secondary | ICD-10-CM

## 2014-10-11 DIAGNOSIS — S78112A Complete traumatic amputation at level between left hip and knee, initial encounter: Secondary | ICD-10-CM

## 2014-10-11 DIAGNOSIS — Z89612 Acquired absence of left leg above knee: Secondary | ICD-10-CM

## 2014-10-11 DIAGNOSIS — I739 Peripheral vascular disease, unspecified: Secondary | ICD-10-CM | POA: Diagnosis not present

## 2014-10-11 NOTE — Progress Notes (Signed)
Cardiology Office Note   Date:  10/11/2014   ID:  Greg Davis, DOB 11-Apr-1956, MRN [id number]  PCP:  No primary care provider on file.  Cardiologist:  Dr. Patty Sermons  Chief Complaint: Post hospital follow-up    History of Present Illness: Greg Davis is a 59 y.o. male who presents for post hospital follow-up. He has history of alcohol abuse, and was admitted with new onset atrial fibrillation with RVR. He converted spontaneously to sinus rhythm on 09/16/14. He was given amiodarone and discharged on 200 mg daily as well as Toprol-XL 12.5 mg daily. [place] fast score is at least 2 for diabetes mellitus and vascular disease but he was not felt to be a good long-term anticoagulation candidate given his alcohol abuse. He has preserved LV EF and normal LA size. The episode may of been a result of the acute illness with gangrene and status post AKA.  The patient is currently in a rehabilitation facility. When he is discharged from there he will be living in [facility] and some room and board Place. He does not have many resources. He denies any chest pain, palpitations, dyspnea, dyspnea on exertion, dizziness or presyncope. He has been nauseous and not feeling well past couple days at rehabilitation. He is not drinking alcohol currently.    Past Medical History  Diagnosis Date  . Diabetes mellitus without complication   . Anemia   . Other cirrhosis of liver   . Acute respiratory failure, unspecified whether with hypoxia or hypercapnia   . Alcohol dependence with withdrawal   . Hypomagnesemia   . 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 Knee surgery    . Eye surgery    . Abdominal aortagram  09/16/2014    Procedure: Abdominal Aortagram;  Surgeon: Sherren Kerns, MD;  Location: [facility] INVASIVE CV LAB;  Service: Cardiovascular;;  . Lower extremity angiogram Bilateral 09/16/2014    Procedure: Lower Extremity Angiogram;   Surgeon: Sherren Kerns, MD;  Location: [facility] INVASIVE CV LAB;  Service: Cardiovascular;  Laterality: Bilateral;  . Amputation Left 09/19/2014    Procedure: AMPUTATION ABOVE KNEE;  Surgeon: Fransisco Hertz, MD;  [facility] OR;  Service: Vascular;  Laterality: Left;     Current Outpatient Prescriptions  Medication Sig Dispense Refill  . amiodarone (PACERONE) 200 MG tablet 400 mg daily for 3 days (between 09/21/2014 and 09/23/2014) and a 200 mg daily afterwards. 40 tablet 0  . ARTIFICIAL TEAR OINTMENT OP Apply to eye 3 (three) times daily.    Marland Kitchen aspirin 81 MG tablet Take 81 mg by mouth daily.    Marland Kitchen atorvastatin (LIPITOR) 80 MG tablet Take 1 tablet (80 mg total) by mouth daily at 6 PM. 30 tablet 0  . diltiazem (DILACOR XR) 240 MG 24 hr capsule Take 240 mg by mouth daily.    . famotidine (PEPCID) 20 MG tablet Take 20 mg by mouth 2 (two) times daily.    . folic acid (FOLVITE) 1 MG tablet Take 1 mg by mouth daily.    Marland Kitchen gabapentin (NEURONTIN) 300 MG capsule Take 300 mg by mouth 2 (two) times daily.    Marland Kitchen HYDROcodone-acetaminophen (NORCO/VICODIN) 5-325 MG per tablet Take 1-2 tablets by mouth every 4 (four) hours as needed for severe pain. 30 tablet 0  . Methocarbamol (ROBAXIN PO) Take 1,000 mg by mouth 3 (three) times daily as needed (muscle spasms).    . metoprolol  succinate (TOPROL-XL) 25 MG 24 hr tablet Take 0.5 tablets (12.5 mg total) by mouth daily. 30 tablet 0  . pantoprazole (PROTONIX) 40 MG tablet Take 1 tablet (40 mg total) by mouth daily. 30 tablet 0  . polyethylene glycol (MIRALAX / GLYCOLAX) packet Take 17 g by mouth daily.    . pregabalin (LYRICA) 50 MG capsule Take 50 mg by mouth 2 (two) times daily.    Marland Kitchen senna (SENOKOT) 8.6 MG TABS tablet Take 2 tablets by mouth at bedtime.    . thiamine (VITAMIN B-1) 100 MG tablet Take 100 mg by mouth daily.    . traZODone (DESYREL) 100 MG tablet Take 100 mg by mouth at bedtime.     No current facility-administered medications for this visit.     Allergies:   Review of patient's allergies indicates no known allergies.    Social History:  The patient  reports that he has been smoking Cigarettes.  He has smoked for the past 15 years. He has never used smokeless tobacco. He reports that he drinks alcohol. He reports that he does not use illicit drugs.   Family History:  The patient's family history includes Hypertension in his mother.    ROS:  Please see the history of present illness.   Otherwise, review of systems are positive for none.   All other systems are reviewed and negative.    PHYSICAL EXAM: VS:  BP 100/70 mmHg  Ht 5' 9.5" (1.765 m)  Wt 142 lb (64.411 kg)  BMI 20.68 kg/m2 , BMI Body mass index is 20.68 kg/(m^2). GEN: Thin, looks older than stated age, in no acute distress Neck: no JVD, HJR, carotid bruits, or masses Cardiac: RRR; no murmurs,gallop, rubs, thrill or heave,  Respiratory:  clear to auscultation bilaterally, normal work of breathing GI: soft, nontender, nondistended, + BS MS: no deformity or atrophy Extremities: Left above-the-knee amputation, right without cyanosis, clubbing, edema, good distal pulses bilaterally.  Skin: warm and dry, no rash Neuro:  Strength and sensation are intact    EKG:  EKG is ordered today. The ekg ordered today demonstrates normal sinus rhythm at 61 bpm, normal EKG   Recent Labs: 09/15/2014: ALT 16* 09/16/2014: TSH 0.781 09/21/2014: BUN 14; Creatinine, Ser 0.83; Hemoglobin 10.2*; Magnesium 1.7; Platelets 396; Potassium 4.4; Sodium 137    Lipid Panel    Component Value Date/Time   CHOL 120 09/15/2014 0804   TRIG 75 09/15/2014 0804   HDL 28* 09/15/2014 0804   CHOLHDL 4.3 09/15/2014 0804   VLDL 15 09/15/2014 0804   LDLCALC 77 09/15/2014 0804      Wt Readings from Last 3 Encounters:  10/11/14 142 lb (64.411 kg)  09/19/14 148 lb 9.6 oz (67.405 kg)  09/14/14 152 lb (68.947 kg)      Other studies Reviewed: Additional studies/ records that were reviewed today  include and review of the records demonstrates:   2D echo 09/16/14 Study Conclusions  - Left ventricle: The cavity size was normal. Wall thickness was   normal. Systolic function was normal. The estimated ejection   fraction was in the range of 55% to 60%. Wall motion was normal;   there were no regional wall motion abnormalities. Left   ventricular diastolic function parameters were normal for the   patient&'s age. - Aortic valve: Mildly calcified annulus. Trileaflet. - Mitral valve: Mildly thickened leaflets . There was mild   regurgitation. - Right atrium: The atrium was at the upper limits of normal in   size. Central  venous pressure (est): 3 mm Hg. - Tricuspid valve: There was mild-moderate regurgitation. - Pulmonary arteries: Systolic pressure was mildly increased. PA   peak pressure: 40 mm Hg (S). - Pericardium, extracardiac: There was no pericardial effusion.  Impressions:  - Normal LV wall thickness with LVEF 55-60%. Normal diastolic   function. Mildly thickened mitral leaflets with mild mitral   regurgitation. Upper normal right atrial size. Mild to moderate   tricuspid regurgitation with PASP 40 mmHg.   ASSESSMENT AND PLAN:  Atrial fibrillation with RVR Patient converted to normal sinus rhythm spontaneously on amiodarone and metoprolol. Continue for now. Chadsvasc score is at least 2. Not a good Coumadin candidate with history of alcohol abuse. Atrial fib occurred in the setting of acute illness. Normal LV function on 2-D echo. Follow-up with Dr. Patty Sermons in 2-3 months.  Above knee amputation of left lower extremity Currently undergoing rehabilitation. Once discharged from the rehabilitation facility he will be living in [facility] without many resources.    Elson Clan, PA-C  10/11/2014 10:43 AM    [facility] [address] [place], [place], [place]  [address] Phone: [phone]; Fax: [phone]

## 2014-10-11 NOTE — Patient Instructions (Signed)
Medication Instructions:   CONTINUE AMIODARONE  CONTINUE  TOPROL   Labwork:   Testing/Procedures:   Follow-Up:  WITH DR BRACKBILL IN 3 TO 4 MONTHS   Any Other Special Instructions Will Be Listed Below (If Applicable).

## 2014-10-11 NOTE — Assessment & Plan Note (Signed)
Currently undergoing rehabilitation. Once discharged from the rehabilitation facility he will be living in [facility] without many resources.

## 2014-10-11 NOTE — Assessment & Plan Note (Addendum)
Patient converted to normal sinus rhythm spontaneously on amiodarone and metoprolol. Continue for now. Chadsvasc score is at least 2. Not a good Coumadin candidate with history of alcohol abuse. Atrial fib occurred in the setting of acute illness. Normal LV function on 2-D echo. Follow-up with Dr. Patty Sermons in 2-3 months.

## 2014-10-19 ENCOUNTER — Encounter: Payer: Self-pay | Admitting: Vascular Surgery

## 2014-10-21 ENCOUNTER — Encounter: Payer: Medicaid Other | Admitting: Vascular Surgery

## 2014-11-02 ENCOUNTER — Encounter: Payer: Self-pay | Admitting: Vascular Surgery

## 2014-11-04 ENCOUNTER — Encounter: Payer: Self-pay | Admitting: Vascular Surgery

## 2014-11-04 ENCOUNTER — Ambulatory Visit (INDEPENDENT_AMBULATORY_CARE_PROVIDER_SITE_OTHER): Payer: Self-pay | Admitting: Vascular Surgery

## 2014-11-04 VITALS — BP 138/82 | HR 65 | Temp 97.6°F | Ht 69.5 in | Wt 130.0 lb

## 2014-11-04 DIAGNOSIS — Z89612 Acquired absence of left leg above knee: Secondary | ICD-10-CM

## 2014-11-04 DIAGNOSIS — S78112A Complete traumatic amputation at level between left hip and knee, initial encounter: Secondary | ICD-10-CM

## 2014-11-04 NOTE — Progress Notes (Signed)
    Postoperative Visit   History of Present Illness  Greg Davis is a 59 y.o. male who presents for postoperative follow-up for: Left above-the-knee amputation (Date: 09/19/14).  The patient's wounds are healed.  The patient notes pain is well controlled.  The patient's current symptoms are: none.  For VQI Use Only  PRE-ADM LIVING: Nursing home  AMB STATUS: Ambulatory  Physical Examination  Filed Vitals:   11/04/14 0851  BP: 138/82  Pulse: 65  Temp: 97.6 F (36.4 C)   LLE: Incision is healed.  Staples are intact.  Medical Decision Making  Greg Davis is a 59 y.o. male who presents s/p L above-the-knee ampuation.  The patient's stump is healing appropriately with resolution of pre-operative symptoms. I discussed in depth with the patient the nature of atherosclerosis, and emphasized the importance of maximal medical management including strict control of blood pressure, blood glucose, and lipid levels, obtaining regular exercise, and cessation of smoking.  The patient is aware that without maximal medical management the underlying atherosclerotic disease process will progress, possibly leading to a more proximal amputation. The patient agrees to participate in their maximal medical care.  Thank you for allowing us to participate in this patient's care.  The patient has been referred for prosthetic fitting.  The patient can follow up with us as needed.  Leonides SakeBrian Deandrew Hoecker, MD [facility] Office: [phone] Pager: [phone]  11/04/2014, 9:05 AM

## 2014-12-22 ENCOUNTER — Encounter: Payer: Self-pay | Admitting: Cardiology

## 2014-12-22 ENCOUNTER — Ambulatory Visit (INDEPENDENT_AMBULATORY_CARE_PROVIDER_SITE_OTHER): Payer: Medicaid Other | Admitting: Cardiology

## 2014-12-22 VITALS — BP 128/78 | HR 50 | Ht 69.5 in

## 2014-12-22 DIAGNOSIS — Z89612 Acquired absence of left leg above knee: Secondary | ICD-10-CM | POA: Diagnosis not present

## 2014-12-22 DIAGNOSIS — I4891 Unspecified atrial fibrillation: Secondary | ICD-10-CM | POA: Diagnosis not present

## 2014-12-22 DIAGNOSIS — I480 Paroxysmal atrial fibrillation: Secondary | ICD-10-CM | POA: Diagnosis not present

## 2014-12-22 DIAGNOSIS — S78112A Complete traumatic amputation at level between left hip and knee, initial encounter: Secondary | ICD-10-CM

## 2014-12-22 NOTE — Patient Instructions (Signed)
Medication Instructions:  DECREASE YOUR AMIODARONE TO 100 MG DAILY  Labwork: NONE  Testing/Procedures: NONE  Follow-Up: Your physician wants you to follow-up in: 6 MONTH OV/EKG  You will receive a reminder letter in the mail two months in advance. If you don't receive a letter, please call our office to schedule the follow-up appointment.

## 2014-12-22 NOTE — Progress Notes (Signed)
Cardiology Office Note   Date:  12/22/2014   ID:  [name], DOB [birth year], MRN [id number]  PCP:  Davis primary care provider on file.  Cardiologist: Cassell Clement MD  Davis chief complaint on file.     History of Present Illness: Greg Davis is a 59 y.o. male who presents for a six-month follow-up visit. Greg Davis is a 59 y.o. male who presents for post hospital follow-up. He has history of alcohol abuse, and was admitted with new onset atrial fibrillation with RVR. He converted spontaneously to sinus rhythm on 09/16/14. He was given amiodarone and discharged on 200 mg daily as well as Toprol-XL 12.5 mg daily. [place] fast score is at least 2 for diabetes mellitus and vascular disease but he was not felt to be a good long-term anticoagulation candidate given his alcohol abuse. He has preserved LV EF and normal LA size. The episode may of been a result of the acute illness with gangrene and status post AKA.  The patient is currently in a rehabilitation facility. When he is discharged from there he will be living in [facility] and some room and board Place. He does not have many resources. He denies any chest pain, palpitations, dyspnea, dyspnea on exertion, dizziness or presyncope. He has been nauseous and not feeling well past couple days at rehabilitation. He is not drinking alcohol currently. Currently he is still living in the rehabilitation facility in [place].  He has not been having any chest pain or shortness of breath.  He has a left AKA.  He is waiting to get his prosthesis.  He has been free of drinking any alcohol since last visit.  He has not had any recurrence of his atrial fibrillation.  Currently he is on amiodarone 200 mg a day.  Past Medical History  Diagnosis Date  . Diabetes mellitus without complication   . Anemia   . Other cirrhosis of liver   . Acute respiratory failure, unspecified whether with hypoxia or hypercapnia   . Alcohol dependence with withdrawal     . Hypomagnesemia   . 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 Knee surgery    . Eye surgery    . Abdominal aortagram  09/16/2014    Procedure: Abdominal Aortagram;  Surgeon: Sherren Kerns, MD;  Location: [facility] INVASIVE CV LAB;  Service: Cardiovascular;;  . Lower extremity angiogram Bilateral 09/16/2014    Procedure: Lower Extremity Angiogram;  Surgeon: Sherren Kerns, MD;  Location: [facility] INVASIVE CV LAB;  Service: Cardiovascular;  Laterality: Bilateral;  . Amputation Left 09/19/2014    Procedure: AMPUTATION ABOVE KNEE;  Surgeon: Fransisco Hertz, MD;  Location: [facility] OR;  Service: Vascular;  Laterality: Left;     Current Outpatient Prescriptions  Medication Sig Dispense Refill  . amiodarone (PACERONE) 100 MG tablet Take 100 mg by mouth daily.    . ARTIFICIAL TEAR OINTMENT OP Apply to eye 3 (three) times daily.    Marland Kitchen aspirin 81 MG tablet Take 81 mg by mouth daily.    Marland Kitchen atorvastatin (LIPITOR) 80 MG tablet Take 1 tablet (80 mg total) by mouth daily at 6 PM. 30 tablet 0  . diltiazem (TIAZAC) 240 MG 24 hr capsule Take 240 mg by mouth at bedtime.    . famotidine (PEPCID) 20 MG tablet Take 20 mg by mouth 2 (two) times daily.    . folic acid (FOLVITE) 1  MG tablet Take 1 mg by mouth daily.    Marland Kitchen gabapentin (NEURONTIN) 300 MG capsule Take 300 mg by mouth 2 (two) times daily.    . Melatonin 3 MG TABS Take 1 tablet by mouth at bedtime.    . Methocarbamol (ROBAXIN PO) Take 1,000 mg by mouth 3 (three) times daily as needed (muscle spasms).    . metoprolol succinate (TOPROL-XL) 25 MG 24 hr tablet Take 12.5 mg by mouth daily.    Marland Kitchen oxyCODONE-acetaminophen (PERCOCET/ROXICET) 5-325 MG per tablet Take by mouth every 6 (six) hours as needed for severe pain.    . pantoprazole (PROTONIX) 40 MG tablet Take 1 tablet (40 mg total) by mouth daily. 30 tablet 0  . polyethylene glycol (MIRALAX / GLYCOLAX) packet Take 17 g by mouth daily.    .  pregabalin (LYRICA) 50 MG capsule Take 50 mg by mouth 2 (two) times daily.    Marland Kitchen senna (SENOKOT) 8.6 MG TABS tablet Take 2 tablets by mouth at bedtime.    . thiamine (VITAMIN B-1) 100 MG tablet Take 100 mg by mouth daily.    . traZODone (DESYREL) 100 MG tablet Take 100 mg by mouth at bedtime.     Davis current facility-administered medications for this visit.    Allergies:   Review of patient's allergies indicates Davis known allergies.    Social History:  The patient  reports that he has been smoking Cigarettes.  He has a 7.5 pack-year smoking history. He has never used smokeless tobacco. He reports that he drinks alcohol. He reports that he does not use illicit drugs.   Family History:  The patient's family history includes Hypertension in his mother.    ROS:  Please see the history of present illness.   Otherwise, review of systems are positive for none.   All other systems are reviewed and negative.    PHYSICAL EXAM: VS:  BP 128/78 mmHg  Pulse 50  Ht 5' 9.5" (1.765 m) , BMI There is Davis weight on file to calculate BMI. GEN: Well nourished, well developed, in Davis acute distress.  Left AKA amputation HEENT: normal Neck: Davis JVD, carotid bruits, or masses.  Blind in left eye. Cardiac: RRR; Davis murmurs, rubs, or gallops,Davis edema  Respiratory:  clear to auscultation bilaterally, normal work of breathing GI: soft, nontender, nondistended, + BS MS: Davis deformity or atrophy Skin: warm and dry, Davis rash Neuro:  Strength and sensation are intact Psych: euthymic mood, full affect   EKG:  EKG is ordered today. The ekg ordered today demonstrates sinus bradycardia 50 bpm   Recent Labs: 09/15/2014: ALT 16* 09/16/2014: TSH 0.781 09/21/2014: BUN 14; Creatinine, Ser 0.83; Hemoglobin 10.2*; Magnesium 1.7; Platelets 396; Potassium 4.4; Sodium 137    Lipid Panel    Component Value Date/Time   CHOL 120 09/15/2014 0804   TRIG 75 09/15/2014 0804   HDL 28* 09/15/2014 0804   CHOLHDL 4.3 09/15/2014 0804     VLDL 15 09/15/2014 0804   LDLCALC 77 09/15/2014 0804      Wt Readings from Last 3 Encounters:  11/04/14 130 lb (58.968 kg)  10/11/14 142 lb (64.411 kg)  09/19/14 148 lb 9.6 oz (67.405 kg)        ASSESSMENT AND PLAN:  1.  Paroxysmal atrial fibrillation, currently maintaining normal sinus rhythm on amiodarone. 2.  Past history of left AKA 3.  Rest history of excessive alcohol use.  Currently in a rehabilitation facility in [place] and he is not drinking  Current medicines are reviewed at length with the patient today.  The patient does not have concerns regarding medicines.  The following changes have been made:  Reduce amiodarone to 100 mg daily.  If he remains in normal sinus rhythm, consider stopping it altogether at his next visit  Labs/ tests ordered today include:   Orders Placed This Encounter  Procedures  . EKG 12-Lead     Disposition: Recheck in 6 months for office visit and EKG  Signed, Cassell Clement MD 12/22/2014 5:09 PM    [facility] [address] [place], [place], [place]  [address] Phone: [phone]; Fax: [phone]

## 2015-09-23 IMAGING — CT CT HEAD W/O CM
2 series · 16 of 30 positions shown, 20 images · non-contrast
Comparison: 05/27/2013

CLINICAL DATA: Confusion, acute encephalopathy

EXAM:
CT HEAD WITHOUT CONTRAST
TECHNIQUE: Contiguous axial images were obtained from the base of the skull
through the vertex without intravenous contrast.

[Series 201: head w/o, idose (1) · axial · non-contrast · 0.49mm/px · z∈[+139,+264]mm · 13 of 31 slices shown, 17 images]
[im 3/31  brain]
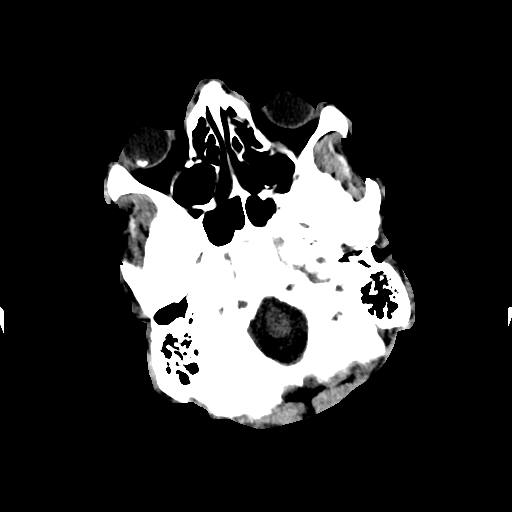
[im 3/31  bone]
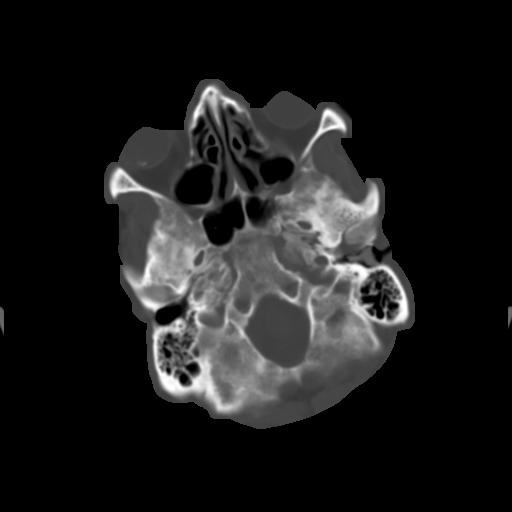
[im 5/31  brain]
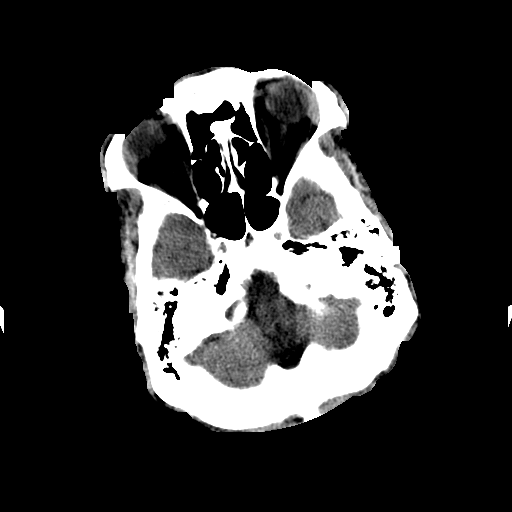
[im 7/31  brain]
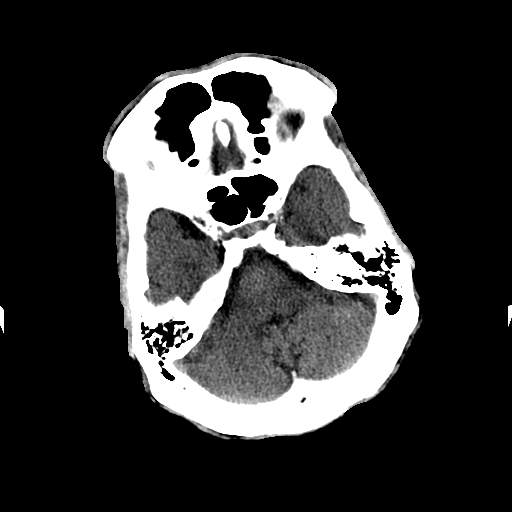
[im 9/31  brain]
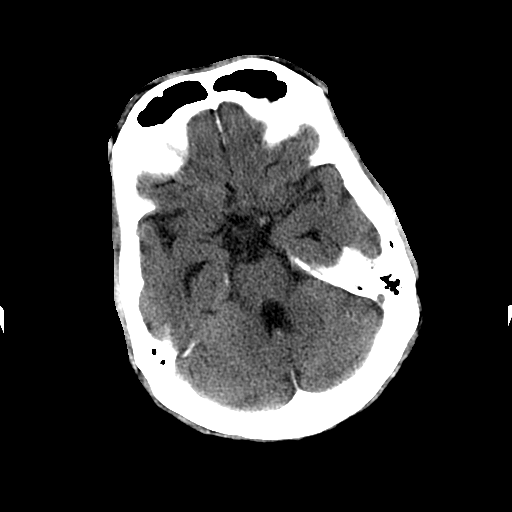
[im 11/31  brain]
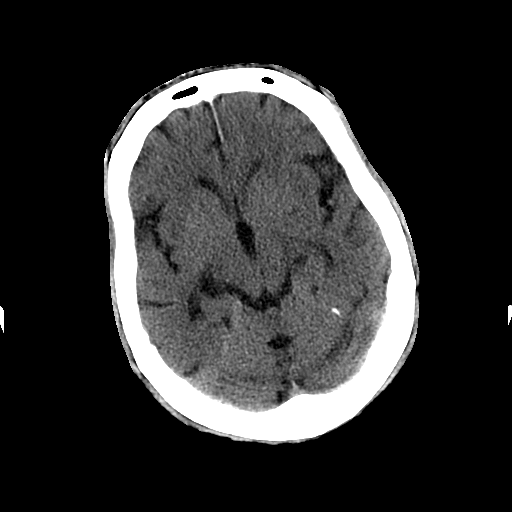
[im 11/31  bone]
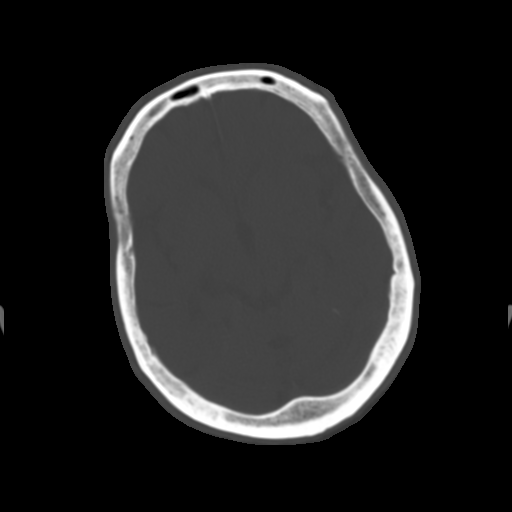
[im 13/31  brain]
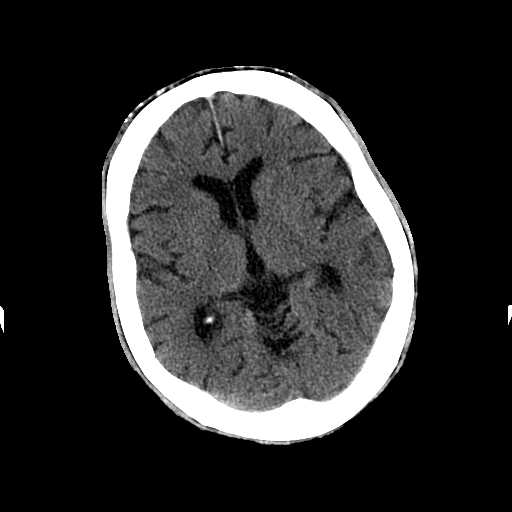
[im 16/31  brain]
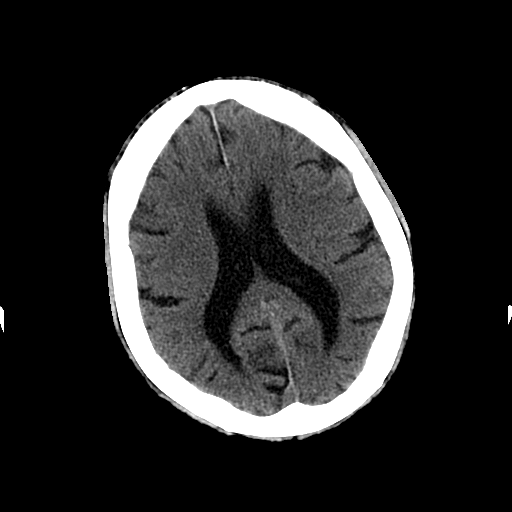
[im 18/31  brain]
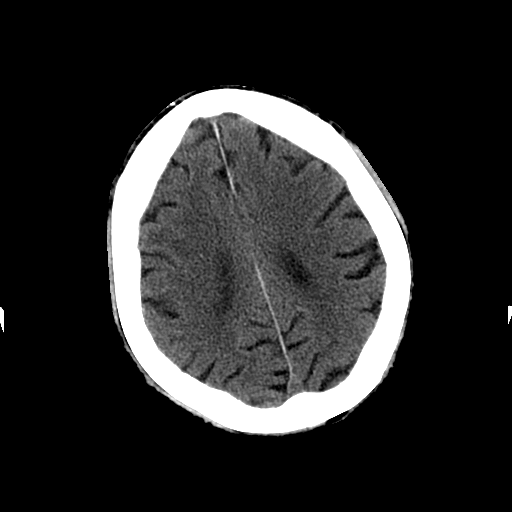
[im 20/31  brain]
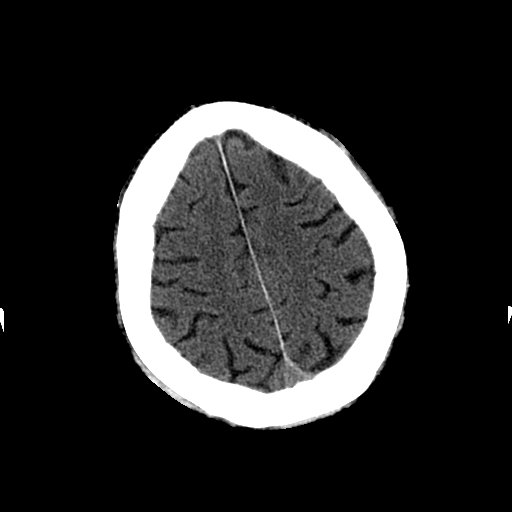
[im 20/31  bone]
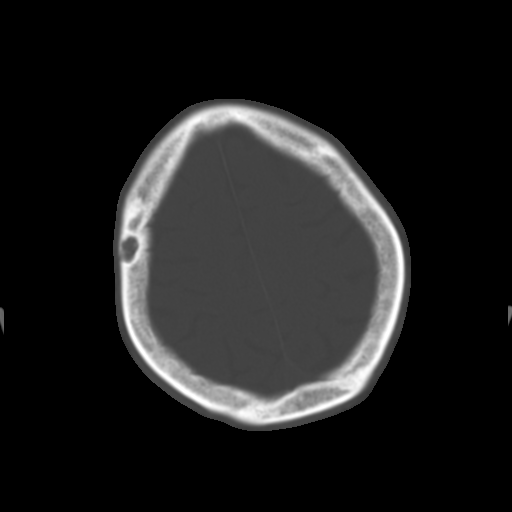
[im 22/31  brain]
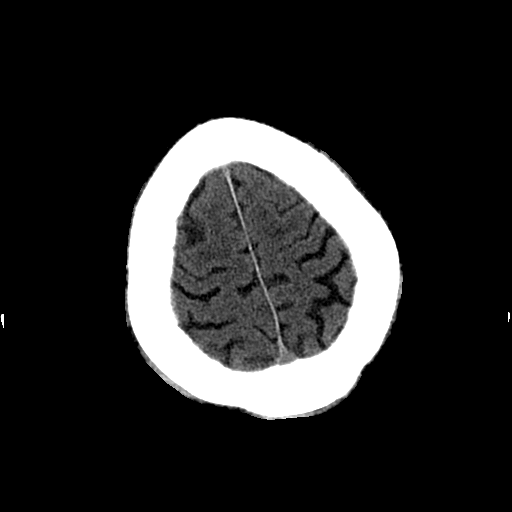
[im 24/31  brain]
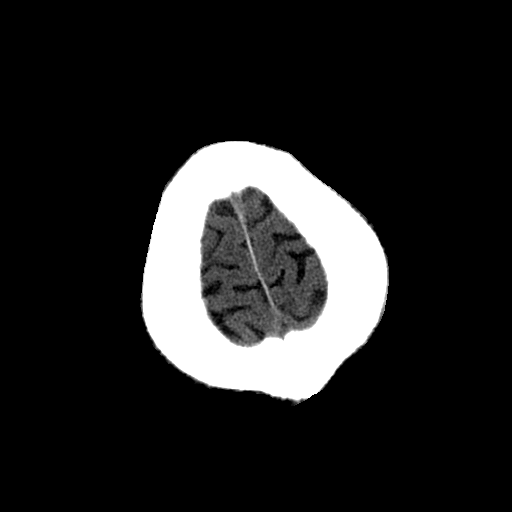
[im 26/31  brain]
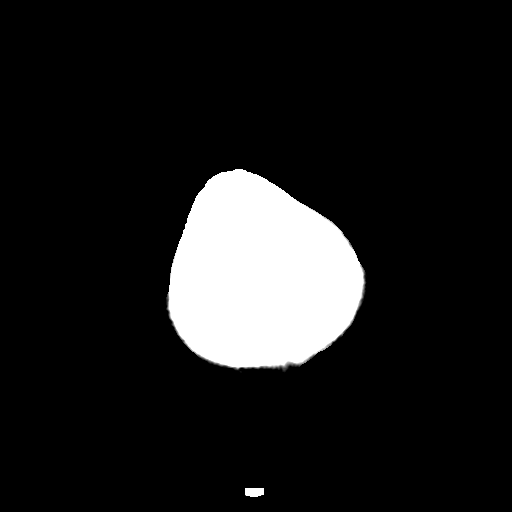
[im 28/31  brain]
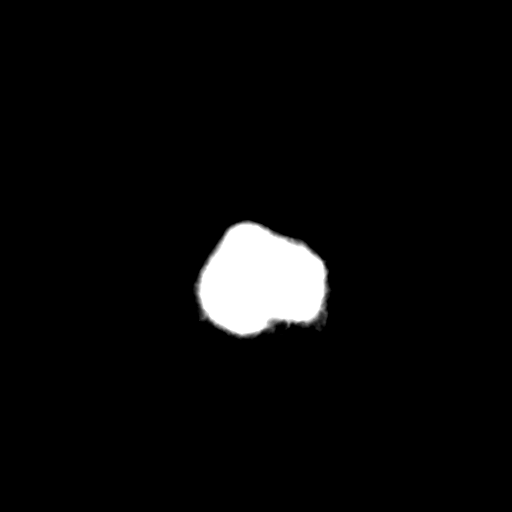
[im 28/31  bone]
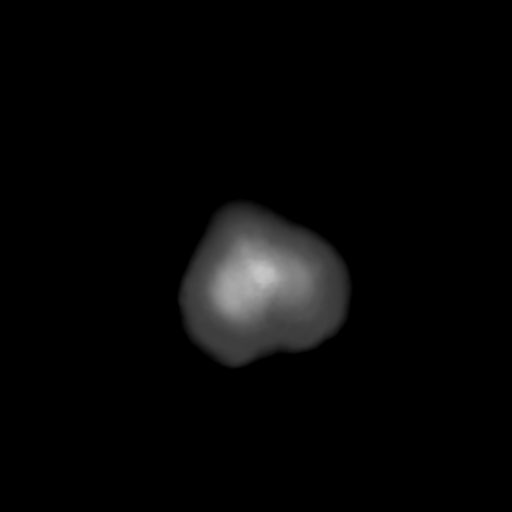

[Series 202: head w/o bone, idose (1) · axial · non-contrast · 0.49mm/px · z∈[+139,+179]mm · 3 of 31 slices shown]
[im 3/31  bone]
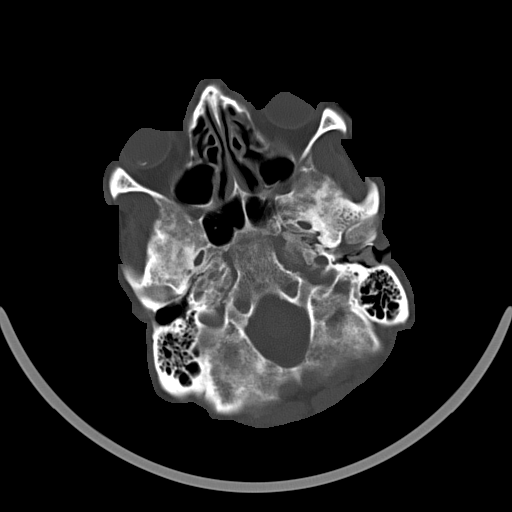
[im 7/31  bone]
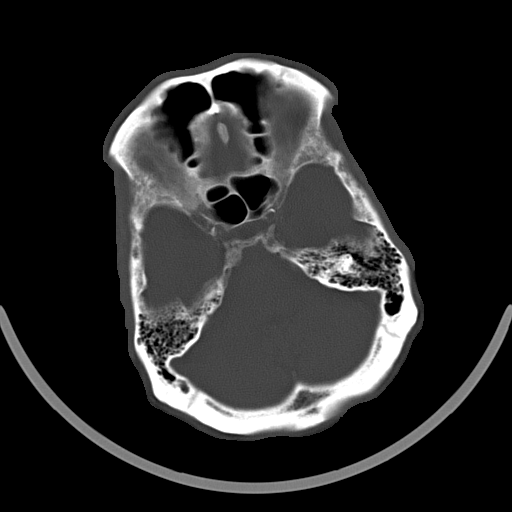
[im 11/31  bone]
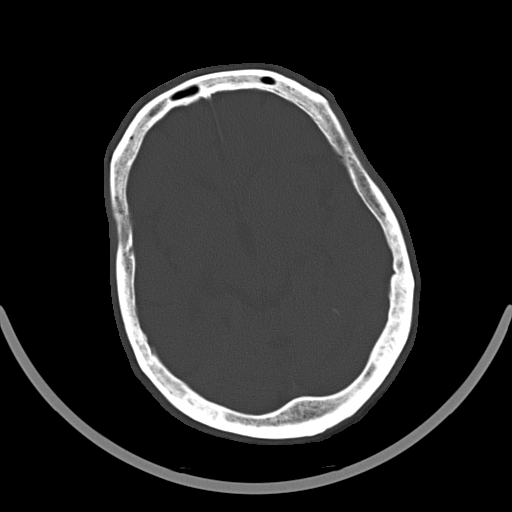

[16 of 30 positions shown; findings below may reference images not displayed]

FINDINGS: No skull fracture are noted. Stable lytic and sclerotic calvarial
foci. Again noted chronic dislocated lens right eye with dystrophic
calcifications. No intracranial hemorrhage, mass effect or midline
shift. Ventricular size is stable from prior exam. No acute cortical
infarction. Atherosclerotic calcifications of carotid siphon again
noted. No mass lesion is noted on this unenhanced scan.
IMPRESSION: No acute intracranial abnormality. Stable mild cerebral atrophy.
Stable chronic findings as described

## 2021-07-26 ENCOUNTER — Other Ambulatory Visit: Payer: Self-pay

## 2021-07-26 DIAGNOSIS — I739 Peripheral vascular disease, unspecified: Secondary | ICD-10-CM

## 2021-07-30 ENCOUNTER — Ambulatory Visit (HOSPITAL_COMMUNITY)
Admission: RE | Admit: 2021-07-30 | Discharge: 2021-07-30 | Disposition: A | Discharge status: Home / Self Care | Payer: Medicaid Other | Source: Ambulatory Visit | Attending: Vascular Surgery | Admitting: Vascular Surgery

## 2021-07-30 DIAGNOSIS — I739 Peripheral vascular disease, unspecified: Secondary | ICD-10-CM | POA: Insufficient documentation

## 2021-08-07 ENCOUNTER — Encounter: Payer: Self-pay | Admitting: Vascular Surgery

## 2021-08-07 ENCOUNTER — Ambulatory Visit (INDEPENDENT_AMBULATORY_CARE_PROVIDER_SITE_OTHER): Payer: Medicaid Other | Admitting: Vascular Surgery

## 2021-08-07 VITALS — BP 152/80 | HR 60 | Temp 98.1°F | Resp 18 | Ht 69.5 in | Wt 169.0 lb

## 2021-08-07 DIAGNOSIS — I739 Peripheral vascular disease, unspecified: Secondary | ICD-10-CM | POA: Diagnosis not present

## 2021-08-07 NOTE — Progress Notes (Signed)
? ? ?Patient name: Greg Davis MRN: [id number] DOB: [birth year] Sex: male ? ?REASON FOR CONSULT: Right leg pain with history of PAD ? ?HPI: ?Greg Davis is a 66 y.o. male, with history of diabetes and cirrhosis that presents for evaluation of right leg pain with history of PAD.  Patient states the pain in his right leg is at the knee.  Has been this way for 6 months and he describes it is intermittent and around the patella.  He denies any associated trauma.  He states he has no pain in the foot or toes.  He has no associated wounds.  He has had a previous left above-knee amputation.  He denies any right leg interventions in the past.  He is in a assisted living facility.  States is able to stand and transfer but spends most of his time in wheelchair. ? ?Past Medical History:  ?Diagnosis Date  ? Acute respiratory failure, unspecified whether with hypoxia or hypercapnia (HCC)   ? Alcohol dependence with withdrawal (HCC)   ? Anemia   ? 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 Diabetes mellitus without complication (HCC)   ? Hypomagnesemia   ? Other cirrhosis of liver (HCC)   ? ? ?Past Surgical History:  ?Procedure Laterality Date  ? ABDOMINAL AORTAGRAM  09/16/2014  ? Procedure: Abdominal Aortagram;  Surgeon: Sherren Kerns, MD;  Location: [facility] INVASIVE CV LAB;  Service: Cardiovascular;;  ? AMPUTATION Left 09/19/2014  ? Procedure: AMPUTATION ABOVE KNEE;  Surgeon: Fransisco Hertz, MD;  Location: [facility] OR;  Service: Vascular;  Laterality: Left;  ? EYE SURGERY    ? KNEE SURGERY    ? LOWER EXTREMITY ANGIOGRAM Bilateral 09/16/2014  ? Procedure: Lower Extremity Angiogram;  Surgeon: Sherren Kerns, MD;  Location: [facility] INVASIVE CV LAB;  Service: Cardiovascular;  Laterality: Bilateral;  ? ? ?Family History  ?Problem Relation Age of Onset  ? Hypertension Mother   ? ? ?SOCIAL HISTORY: ?Social History  ? ?Socioeconomic History  ? Marital status: Single  ?  Spouse name: Not on file  ? Number of children: Not on file   ? Years of education: Not on file  ? Highest education level: Not on file  ?Occupational History  ? Not on file  ?Tobacco Use  ? Smoking status: Every Day  ?  Packs/day: 0.50  ?  Years: 15.00  ?  Pack years: 7.50  ?  Types: Cigarettes  ? Smokeless tobacco: Never  ?Substance and Sexual Activity  ? Alcohol use: Yes  ?  Alcohol/week: 0.0 standard drinks  ? Drug use: No  ? Sexual activity: Not on file  ?Other Topics Concern  ? Not on file  ?Social History Narrative  ? Not on file  ? ?Social Determinants of Health  ? ?Financial Resource Strain: Not on file  ?Food Insecurity: Not on file  ?Transportation Needs: Not on file  ?Physical Activity: Not on file  ?Stress: Not on file  ?Social Connections: Not on file  ?Intimate Partner Violence: Not on file  ? ? ?No Known Allergies ? ?Current Outpatient Medications  ?Medication Sig Dispense Refill  ? amiodarone (PACERONE) 100 MG tablet Take 100 mg by mouth daily.    ? ARTIFICIAL TEAR OINTMENT OP Apply to eye 3 (three) times daily.    ? aspirin 81 MG tablet Take 81 mg by mouth daily.    ? atorvastatin (LIPITOR) 80 MG tablet Take 1 tablet (80 mg total) by mouth daily at  6 PM. 30 tablet 0  ? diltiazem (TIAZAC) 240 MG 24 hr capsule Take 240 mg by mouth at bedtime.    ? famotidine (PEPCID) 20 MG tablet Take 20 mg by mouth 2 (two) times daily.    ? folic acid (FOLVITE) 1 MG tablet Take 1 mg by mouth daily.    ? gabapentin (NEURONTIN) 300 MG capsule Take 300 mg by mouth 2 (two) times daily.    ? Melatonin 3 MG TABS Take 1 tablet by mouth at bedtime.    ? Methocarbamol (ROBAXIN PO) Take 1,000 mg by mouth 3 (three) times daily as needed (muscle spasms).    ? metoprolol succinate (TOPROL-XL) 25 MG 24 hr tablet Take 12.5 mg by mouth daily.    ? oxyCODONE-acetaminophen (PERCOCET/ROXICET) 5-325 MG per tablet Take by mouth every 6 (six) hours as needed for severe pain.    ? pantoprazole (PROTONIX) 40 MG tablet Take 1 tablet (40 mg total) by mouth daily. 30 tablet 0  ? polyethylene glycol  (MIRALAX / GLYCOLAX) packet Take 17 g by mouth daily.    ? pregabalin (LYRICA) 50 MG capsule Take 50 mg by mouth 2 (two) times daily.    ? senna (SENOKOT) 8.6 MG TABS tablet Take 2 tablets by mouth at bedtime.    ? thiamine (VITAMIN B-1) 100 MG tablet Take 100 mg by mouth daily.    ? traZODone (DESYREL) 100 MG tablet Take 100 mg by mouth at bedtime.    ? ?No current facility-administered medications for this visit.  ? ? ?REVIEW OF SYSTEMS:  ?[X]  denotes positive finding, [ ]  denotes negative finding ?Cardiac  Comments:  ?Chest pain or chest pressure:    ?Shortness of breath upon exertion:    ?Short of breath when lying flat:    ?Irregular heart rhythm:    ?    ?Vascular    ?Pain in calf, thigh, or hip brought on by ambulation:    ?Pain in feet at night that wakes you up from your sleep:     ?Blood clot in your veins:    ?Leg swelling:     ?    ?Pulmonary    ?Oxygen at home:    ?Productive cough:     ?Wheezing:     ?    ?Neurologic    ?Sudden weakness in arms or legs:     ?Sudden numbness in arms or legs:     ?Sudden onset of difficulty speaking or slurred speech:    ?Temporary loss of vision in one eye:     ?Problems with dizziness:     ?    ?Gastrointestinal    ?Blood in stool:     ?Vomited blood:     ?    ?Genitourinary    ?Burning when urinating:     ?Blood in urine:    ?    ?Psychiatric    ?Major depression:     ?    ?Hematologic    ?Bleeding problems:    ?Problems with blood clotting too easily:    ?    ?Skin    ?Rashes or ulcers:    ?    ?Constitutional    ?Fever or chills:    ? ? ?PHYSICAL EXAM: ?There were no vitals filed for this visit. ? ?GENERAL: The patient is a well-nourished male, in no acute distress. The vital signs are documented above. ?CARDIAC: There is a regular rate and rhythm.  ?VASCULAR:  ?Right femoral pulse palpable ?Right DP and  PT monophasic ?Left above-knee amputation ?No right lower extremity tissue loss ?PULMONARY: No respiratory distress. ?ABDOMEN: Soft and  non-tender. ?MUSCULOSKELETAL: There are no major deformities or cyanosis. ?NEUROLOGIC: No focal weakness or paresthesias are detected. ?SKIN: There are no ulcers or rashes noted. ?PSYCHIATRIC: The patient has a normal affect. ? ?DATA:  ? ?ABIs 07/30/21 are 0.6 on the right dampened monophasic. ? ?Assessment/Plan: ? ?66 year old male that presents for evaluation of right leg pain with known PAD with previous left above-knee amputation.  I discussed that he certainly has evidence of peripheral arterial disease with an ABI of 0.6 on the right and monophasic flow.  I can get monophasic signals in the dorsalis pedis and posterior tibial.  That being said, he has no associated pain in the foot or toes consistent with rest pain and no tissue loss on exam.  He states all of his pain is around the knee and specifically the patella.  He has a palpable femoral pulse and I do not think this knee pain is due to arterial insufficiency and unlikely to be of vascular etiology.  I discussed all of this with him and it sounds more like arthritis or other joint etiology.  Discussed the typical presentation of peripheral arterial disease.  I offered follow-up in 1 year for ongoing surveillance given his abnormal ABIs.  He would like to call if his symptoms change or worsen.  I specifically discussed if he gets wounds or ulcers on his feet that are not healing or starts having pain in the foot or toes to let me know. ? ? ?Cephus Shellinghristopher J. Xavian Hardcastle, MD ?[facility] ?Office: [phone] ? ? ? ? ?

## 2024-03-15 DIAGNOSIS — E119 Type 2 diabetes mellitus without complications: Secondary | ICD-10-CM | POA: Insufficient documentation

## 2024-03-15 DIAGNOSIS — K7469 Other cirrhosis of liver: Secondary | ICD-10-CM | POA: Insufficient documentation

## 2024-03-15 DIAGNOSIS — F10239 Alcohol dependence with withdrawal, unspecified: Secondary | ICD-10-CM | POA: Insufficient documentation

## 2024-03-15 DIAGNOSIS — H5410 Blindness, one eye, low vision other eye, unspecified eyes: Secondary | ICD-10-CM | POA: Insufficient documentation

## 2024-03-15 DIAGNOSIS — J9600 Acute respiratory failure, unspecified whether with hypoxia or hypercapnia: Secondary | ICD-10-CM | POA: Insufficient documentation

## 2024-03-15 DIAGNOSIS — Z01810 Encounter for preprocedural cardiovascular examination: Secondary | ICD-10-CM | POA: Insufficient documentation

## 2024-03-16 ENCOUNTER — Ambulatory Visit: Attending: Cardiology | Admitting: Cardiology

## 2024-03-16 ENCOUNTER — Encounter: Payer: Self-pay | Admitting: Cardiology

## 2024-03-16 VITALS — BP 120/70 | HR 80 | Ht 72.0 in | Wt 179.0 lb

## 2024-03-16 DIAGNOSIS — Z79899 Other long term (current) drug therapy: Secondary | ICD-10-CM | POA: Diagnosis present

## 2024-03-16 DIAGNOSIS — S78112A Complete traumatic amputation at level between left hip and knee, initial encounter: Secondary | ICD-10-CM | POA: Diagnosis present

## 2024-03-16 DIAGNOSIS — S78112D Complete traumatic amputation at level between left hip and knee, subsequent encounter: Secondary | ICD-10-CM | POA: Diagnosis not present

## 2024-03-16 DIAGNOSIS — I739 Peripheral vascular disease, unspecified: Secondary | ICD-10-CM | POA: Insufficient documentation

## 2024-03-16 DIAGNOSIS — Z01810 Encounter for preprocedural cardiovascular examination: Secondary | ICD-10-CM | POA: Diagnosis not present

## 2024-03-16 DIAGNOSIS — E782 Mixed hyperlipidemia: Secondary | ICD-10-CM | POA: Insufficient documentation

## 2024-03-16 DIAGNOSIS — I480 Paroxysmal atrial fibrillation: Secondary | ICD-10-CM | POA: Insufficient documentation

## 2024-03-16 NOTE — Patient Instructions (Signed)
 Medication Instructions:  Your physician recommends that you continue on your current medications as directed. Please refer to the Current Medication list given to you today.  *If you need a refill on your cardiac medications before your next appointment, please call your pharmacy*   Lab Work: None ordered If you have labs (blood work) drawn today and your tests are completely normal, you will receive your results only by: MyChart Message (if you have MyChart) OR A paper copy in the mail If you have any lab test that is abnormal or we need to change your treatment, we will call you to review the results.   Testing/Procedures: Your physician has requested that you have an echocardiogram. Echocardiography is a painless test that uses sound waves to create images of your heart. It provides your doctor with information about the size and shape of your heart and how well your heart's chambers and valves are working. This procedure takes approximately one hour. There are no restrictions for this procedure. Please do NOT wear cologne, perfume, aftershave, or lotions (deodorant is allowed). Please arrive 15 minutes prior to your appointment time.  Please note: We ask at that you not bring children with you during ultrasound (echo/ vascular) testing. Due to room size and safety concerns, children are not allowed in the ultrasound rooms during exams. Our front office staff cannot provide observation of children in our lobby area while testing is being conducted. An adult accompanying a patient to their appointment will only be allowed in the ultrasound room at the discretion of the ultrasound technician under special circumstances. We apologize for any inconvenience.    Follow-Up: At [facility], you and your health needs are our priority.  As part of our continuing mission to provide you with exceptional heart care, we have created designated Provider Care Teams.  These Care Teams include your  primary Cardiologist (physician) and Advanced Practice Providers (APPs -  Physician Assistants and Nurse Practitioners) who all work together to provide you with the care you need, when you need it.  We recommend signing up for the patient portal called "MyChart".  Sign up information is provided on this After Visit Summary.  MyChart is used to connect with patients for Virtual Visits (Telemedicine).  Patients are able to view lab/test results, encounter notes, upcoming appointments, etc.  Non-urgent messages can be sent to your provider as well.   To learn more about what you can do with MyChart, go to [email or web address].    Your next appointment:   12 month(s)  The format for your next appointment:   In Person  Provider:   Hillis Lu, MD   Other Instructions Echocardiogram An echocardiogram is a test that uses sound waves (ultrasound) to produce images of the heart. Images from an echocardiogram can provide important information about: Heart size and shape. The size and thickness and movement of your heart's walls. Heart muscle function and strength. Heart valve function or if you have stenosis. Stenosis is when the heart valves are too narrow. If blood is flowing backward through the heart valves (regurgitation). A tumor or infectious growth around the heart valves. Areas of heart muscle that are not working well because of poor blood flow or injury from a heart attack. Aneurysm detection. An aneurysm is a weak or damaged part of an artery wall. The wall bulges out from the normal force of blood pumping through the body. Tell a health care provider about: Any allergies you have. All medicines you are taking,  including vitamins, herbs, eye drops, creams, and over-the-counter medicines. Any blood disorders you have. Any surgeries you have had. Any medical conditions you have. Whether you are pregnant or may be pregnant. What are the risks? Generally, this is a safe test.  However, problems may occur, including an allergic reaction to dye (contrast) that may be used during the test. What happens before the test? No specific preparation is needed. You may eat and drink normally. What happens during the test? You will take off your clothes from the waist up and put on a hospital gown. Electrodes or electrocardiogram (ECG)patches may be placed on your chest. The electrodes or patches are then connected to a device that monitors your heart rate and rhythm. You will lie down on a table for an ultrasound exam. A gel will be applied to your chest to help sound waves pass through your skin. A handheld device, called a transducer, will be pressed against your chest and moved over your heart. The transducer produces sound waves that travel to your heart and bounce back (or "echo" back) to the transducer. These sound waves will be captured in real-time and changed into images of your heart that can be viewed on a video monitor. The images will be recorded on a computer and reviewed by your health care provider. You may be asked to change positions or hold your breath for a short time. This makes it easier to get different views or better views of your heart. In some cases, you may receive contrast through an IV in one of your veins. This can improve the quality of the pictures from your heart. The procedure may vary among health care providers and hospitals.   What can I expect after the test? You may return to your normal, everyday life, including diet, activities, and medicines, unless your health care provider tells you not to do that. Follow these instructions at home: It is up to you to get the results of your test. Ask your health care provider, or the department that is doing the test, when your results will be ready. Keep all follow-up visits. This is important. Summary An echocardiogram is a test that uses sound waves (ultrasound) to produce images of the heart. Images  from an echocardiogram can provide important information about the size and shape of your heart, heart muscle function, heart valve function, and other possible heart problems. You do not need to do anything to prepare before this test. You may eat and drink normally. After the echocardiogram is completed, you may return to your normal, everyday life, unless your health care provider tells you not to do that. This information is not intended to replace advice given to you by your health care provider. Make sure you discuss any questions you have with your health care provider. Document Revised: 11/30/2019 Document Reviewed: 11/30/2019 Elsevier Patient Education  2021 Elsevier Inc.   Important Information About Sugar

## 2024-03-16 NOTE — Progress Notes (Signed)
 Cardiology Office Note:    Date:  03/16/2024   ID:  Greg Davis, DOB 04/06/1956, MRN [id number]  PCP:  Pcp, No  Cardiologist:  Jennifer JONELLE Crape, MD   Referring MD: Trinidad Glisson, MD    ASSESSMENT:    1. Preop cardiovascular exam   2. PVD (peripheral vascular disease)   3. Above knee amputation of left lower extremity (HCC)   4. Paroxysmal atrial fibrillation (HCC)   5. On amiodarone  therapy   6. Mixed dyslipidemia    PLAN:    In order of problems listed above:  Atherosclerotic vascular disease: Primary prevention stressed with the patient.  Importance of compliance with diet medication stressed and patient verbalized standing. Patient mentions that he is asymptomatic.  He has no chest pain with activities of daily living or with stress or any such issues. Paroxysmal atrial fibrillation: I discussed with the patient atrial fibrillation, disease process. Management and therapy including rate and rhythm control, anticoagulation benefits and potential risks were discussed extensively with the patient. Patient had multiple questions which were answered to patient's satisfaction.  There is no history of falls. Amiodarone  therapy: Benefits and potential risks explained and he vocalized understanding.  I will see him on an annual basis.  Therefore I would let his primary care handled the following recommendations for amiodarone  therapy patients.  He needs to get liver tests and TSH so that this is under close monitor.  Also a CBC on a regular basis would be good so that he has no bleeding issues.  Patient is with amiodarone  therapy recommended to have an annual chest x-ray and I would recommend this be done as recommendations. Mixed dyslipidemia: On lipid-lowering medications.  Diet emphasized.  Goal LDL must be less than 60. Colonoscopy: The plan is to hold anticoagulation for the least period of time possible as recommended by his physician performing the colonoscopy.  Benefits risks  explained.  Bridging explained with Lovenox but is not keen on this. Patient will be seen in follow-up appointment in 12 months or earlier if the patient has any concerns.  For this reason above monitoring with test recommended will need to be done by primary care.    Medication Adjustments/Labs and Tests Ordered: Current medicines are reviewed at length with the patient today.  Concerns regarding medicines are outlined above.  Orders Placed This Encounter  Procedures   EKG 12-Lead   ECHOCARDIOGRAM COMPLETE   No orders of the defined types were placed in this encounter.    History of Present Illness:    Greg Davis is a 68 y.o. male who is being seen today for the evaluation of vascular disease and atrial fibrillation and to be established at the request of Trinidad Glisson, MD. patient has past medical history of paroxysmal atrial fibrillation, amiodarone  therapy, mixed dyslipidemia, peripheral vascular disease, history of essential hypertension and alcohol abuse.  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  He has amputation in the past because of peripheral vascular disease of the lower extremity.  He is on a wheelchair.  He denies any chest pain orthopnea or PND.  At the time of my evaluation, the patient is alert awake oriented and in no distress.  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    Diabetes mellitus without complication (HCC)    Hypomagnesemia    Other cirrhosis of liver (HCC)    Prediabetes 09/20/2014   Preop cardiovascular exam    PVD (peripheral vascular disease)     Past  Surgical History:  Procedure Laterality Date   ABDOMINAL AORTAGRAM  09/16/2014   Procedure: Abdominal Aortagram;  Surgeon: Carlin FORBES Haddock, MD;  Location: [facility] INVASIVE CV LAB;  Service: Cardiovascular;;   AMPUTATION Left 09/19/2014   Procedure: AMPUTATION ABOVE KNEE;  Surgeon: Redell LITTIE Door, MD;  Location: [facility] OR;  Service: Vascular;  Laterality: Left;   EYE SURGERY     KNEE SURGERY     LOWER EXTREMITY ANGIOGRAM Bilateral 09/16/2014   Procedure: Lower Extremity Angiogram;  Surgeon: Carlin FORBES Haddock, MD;  Location: [facility] INVASIVE CV LAB;  Service: Cardiovascular;  Laterality: Bilateral;    Current Medications: Current Meds  Medication Sig   acetaminophen  (TYLENOL ) 500 MG tablet Take 500 mg by mouth every 12 (twelve) hours as needed for mild pain (pain score 1-3) or moderate pain (pain score 4-6).   albuterol (VENTOLIN HFA) 108 (90 Base) MCG/ACT inhaler Inhale 2 puffs into the lungs every 6 (six) hours as needed for wheezing or shortness of breath.   amiodarone  (PACERONE ) 100 MG tablet Take 100 mg by mouth daily.   ARTIFICIAL TEAR OINTMENT OP Apply to eye 3 (three) times daily.   aspirin  81 MG tablet Take 81 mg by mouth daily.   atorvastatin  (LIPITOR ) 20 MG tablet Take 20 mg by mouth daily.   baclofen (LIORESAL) 10 MG tablet Take 10 mg by mouth 3 (three) times daily.   diltiazem  (CARDIZEM  CD) 120 MG 24 hr capsule Take 120 mg by mouth daily.   ferrous sulfate 325 (65 FE) MG tablet Take 325 mg by mouth daily with breakfast.   gabapentin (NEURONTIN) 800 MG tablet Take 800 mg by mouth 3 (three) times daily.   Lidocaine  HCl (ASPERCREME LIDOCAINE ) 4 % CREA Apply 1 Application topically daily.   lisinopril (ZESTRIL) 10 MG tablet Take 10 mg by mouth daily.   loratadine (CLARITIN) 10 MG tablet Take 10 mg by mouth daily.   oxyCODONE -acetaminophen  (PERCOCET/ROXICET) 5-325 MG per tablet Take by mouth every 6 (six) hours as needed for severe pain.   polyethylene glycol (MIRALAX / GLYCOLAX) packet Take 17 g by  mouth daily.   senna (SENOKOT) 8.6 MG TABS tablet Take 2 tablets by mouth at bedtime.   SYMBICORT 160-4.5 MCG/ACT inhaler Inhale 2 puffs into the lungs 2 (two) times daily.   traZODone (DESYREL) 100 MG tablet Take 100 mg by mouth at bedtime.   XARELTO 20 MG TABS tablet Take 20 mg by mouth daily.     Allergies:   Patient has no known allergies.   Social History   Socioeconomic History   Marital status: Single    Spouse name: Not on file   Number of children: Not on file   Years of education: Not on file   Highest education level: Not on file  Occupational History   Not on file  Tobacco Use   Smoking status: Every Day    Current packs/day: 0.50    Average packs/day: 0.5 packs/day for 15.0 years (7.5 ttl pk-yrs)    Types: Cigarettes   Smokeless tobacco: Never  Substance and Sexual Activity   Alcohol use: Yes  Alcohol/week: 0.0 standard drinks of alcohol   Drug use: No   Sexual activity: Not on file  Other Topics Concern   Not on file  Social History Narrative   Not on file   Social Drivers of Health   Financial Resource Strain: Not on file  Food Insecurity: Not on file  Transportation Needs: Not on file  Physical Activity: Not on file  Stress: Not on file  Social Connections: Not on file     Family History: The patient's family history includes Hypertension in his mother.  ROS:   Please see the history of present illness.    All other systems reviewed and are negative.  EKGs/Labs/Other Studies Reviewed:    The following studies were reviewed today:  EKG Interpretation Date/Time:  Tuesday March 16 2024 08:11:40 EST Ventricular Rate:  80 PR Interval:  130 QRS Duration:  88 QT Interval:  414 QTC Calculation: 477 R Axis:   23  Text Interpretation: Normal sinus rhythm Nonspecific T wave abnormality Prolonged QT Abnormal ECG When compared with ECG of 16-Sep-2014 06:10, Nonspecific T wave abnormality now evident in Inferior leads Nonspecific T wave  abnormality now evident in Anterolateral leads Confirmed by Edwyna Backers [phone]) on 03/16/2024 8:24:16 AM     Recent Labs: No results found for requested labs within last 365 days.  Recent Lipid Panel    Component Value Date/Time   CHOL 120 09/15/2014 0804   TRIG 75 09/15/2014 0804   HDL 28 (L) 09/15/2014 0804   CHOLHDL 4.3 09/15/2014 0804   VLDL 15 09/15/2014 0804   LDLCALC 77 09/15/2014 0804    Physical Exam:    VS:  BP 120/70   Pulse 80   Ht 6' (1.829 m)   Wt 179 lb (81.2 kg)   SpO2 94%   BMI 24.28 kg/m     Wt Readings from Last 3 Encounters:  03/16/24 179 lb (81.2 kg)  08/07/21 169 lb (76.7 kg)  11/04/14 130 lb (59 kg)     GEN: Patient is in no acute distress HEENT: Normal NECK: No JVD; No carotid bruits LYMPHATICS: No lymphadenopathy CARDIAC: S1 S2 regular, 2/6 systolic murmur at the apex. RESPIRATORY:  Clear to auscultation without rales, wheezing or rhonchi  ABDOMEN: Soft, non-tender, non-distended MUSCULOSKELETAL:  No edema; No deformity  SKIN: Warm and dry NEUROLOGIC:  Alert and oriented x 3 PSYCHIATRIC:  Normal affect    Signed, Backers JONELLE Edwyna, MD  03/16/2024 8:34 AM    [facility]

## 2024-04-09 ENCOUNTER — Ambulatory Visit: Attending: Cardiology

## 2024-04-09 DIAGNOSIS — Z01810 Encounter for preprocedural cardiovascular examination: Secondary | ICD-10-CM | POA: Diagnosis not present

## 2024-04-09 LAB — ECHOCARDIOGRAM COMPLETE
Area-P 1/2: 4.5 cm2
MV M vel: 3.83 m/s
MV Peak grad: 58.7 mmHg
S' Lateral: 3.3 cm

## 2024-04-13 ENCOUNTER — Ambulatory Visit: Payer: Self-pay | Admitting: Cardiology
